# Patient Record
Sex: Female | Born: 1955 | Race: Black or African American | Hispanic: No | Smoking: Never smoker
Health system: Southern US, Community
[De-identification: ages and names within clinical notes are randomized; demographics above are authoritative.]

## PROBLEM LIST (undated history)

## (undated) DIAGNOSIS — H3320 Serous retinal detachment, unspecified eye: Secondary | ICD-10-CM

## (undated) DIAGNOSIS — H543 Unqualified visual loss, both eyes: Secondary | ICD-10-CM

## (undated) DIAGNOSIS — H5210 Myopia, unspecified eye: Secondary | ICD-10-CM

## (undated) DIAGNOSIS — J45909 Unspecified asthma, uncomplicated: Secondary | ICD-10-CM

## (undated) DIAGNOSIS — K660 Peritoneal adhesions (postprocedural) (postinfection): Secondary | ICD-10-CM

## (undated) DIAGNOSIS — E538 Deficiency of other specified B group vitamins: Secondary | ICD-10-CM

## (undated) DIAGNOSIS — H269 Unspecified cataract: Secondary | ICD-10-CM

## (undated) DIAGNOSIS — R112 Nausea with vomiting, unspecified: Secondary | ICD-10-CM

## (undated) DIAGNOSIS — J189 Pneumonia, unspecified organism: Secondary | ICD-10-CM

## (undated) DIAGNOSIS — Z9889 Other specified postprocedural states: Secondary | ICD-10-CM

## (undated) DIAGNOSIS — K589 Irritable bowel syndrome without diarrhea: Secondary | ICD-10-CM

## (undated) DIAGNOSIS — D649 Anemia, unspecified: Secondary | ICD-10-CM

## (undated) DIAGNOSIS — E782 Mixed hyperlipidemia: Secondary | ICD-10-CM

## (undated) HISTORY — PX: KNEE ARTHROSCOPY: SUR90

## (undated) HISTORY — PX: EYE SURGERY: SHX253

## (undated) HISTORY — PX: PAROTIDECTOMY: SUR1003

## (undated) HISTORY — PX: ABDOMINAL HYSTERECTOMY: SHX81

---

## 1983-06-29 HISTORY — PX: RETINAL DETACHMENT SURGERY: SHX105

## 1983-08-29 HISTORY — PX: RETINAL DETACHMENT SURGERY: SHX105

## 1988-08-28 HISTORY — PX: VAGINAL HYSTERECTOMY: SHX2639

## 1990-08-28 HISTORY — PX: OOPHORECTOMY: SHX86

## 1992-08-28 HISTORY — PX: OOPHORECTOMY: SHX6387

## 2005-12-08 ENCOUNTER — Ambulatory Visit: Payer: Self-pay

## 2006-01-01 ENCOUNTER — Ambulatory Visit: Payer: Self-pay | Admitting: Unknown Physician Specialty

## 2008-03-17 ENCOUNTER — Ambulatory Visit: Payer: Self-pay | Admitting: Internal Medicine

## 2008-03-19 ENCOUNTER — Ambulatory Visit: Payer: Self-pay | Admitting: Internal Medicine

## 2008-03-23 ENCOUNTER — Ambulatory Visit: Payer: Self-pay | Admitting: Internal Medicine

## 2008-05-08 ENCOUNTER — Ambulatory Visit: Payer: Self-pay | Admitting: Gastroenterology

## 2008-05-12 ENCOUNTER — Ambulatory Visit: Payer: Self-pay | Admitting: Gastroenterology

## 2008-12-29 ENCOUNTER — Ambulatory Visit: Payer: Self-pay | Admitting: Unknown Physician Specialty

## 2011-08-20 ENCOUNTER — Ambulatory Visit: Payer: Self-pay | Admitting: Orthopedic Surgery

## 2012-03-22 DIAGNOSIS — R109 Unspecified abdominal pain: Secondary | ICD-10-CM | POA: Insufficient documentation

## 2012-08-01 DIAGNOSIS — H5213 Myopia, bilateral: Secondary | ICD-10-CM | POA: Insufficient documentation

## 2014-07-06 ENCOUNTER — Ambulatory Visit: Payer: Self-pay | Admitting: Family Medicine

## 2015-08-29 HISTORY — PX: CATARACT EXTRACTION W/ INTRAOCULAR LENS IMPLANT: SHX1309

## 2015-08-29 HISTORY — PX: RETINAL DETACHMENT SURGERY: SHX105

## 2016-09-27 ENCOUNTER — Other Ambulatory Visit: Payer: Self-pay | Admitting: Surgery

## 2016-09-27 DIAGNOSIS — M7542 Impingement syndrome of left shoulder: Secondary | ICD-10-CM

## 2016-09-27 DIAGNOSIS — M7582 Other shoulder lesions, left shoulder: Secondary | ICD-10-CM

## 2016-10-10 ENCOUNTER — Ambulatory Visit
Admission: RE | Admit: 2016-10-10 | Discharge: 2016-10-10 | Disposition: A | Discharge status: Home / Self Care | Payer: Medicare Other | Source: Ambulatory Visit | Attending: Surgery | Admitting: Surgery

## 2016-10-10 DIAGNOSIS — M7542 Impingement syndrome of left shoulder: Secondary | ICD-10-CM | POA: Insufficient documentation

## 2016-10-10 DIAGNOSIS — M7582 Other shoulder lesions, left shoulder: Secondary | ICD-10-CM | POA: Diagnosis not present

## 2016-11-20 DIAGNOSIS — E782 Mixed hyperlipidemia: Secondary | ICD-10-CM | POA: Insufficient documentation

## 2017-07-30 ENCOUNTER — Other Ambulatory Visit: Payer: Self-pay | Admitting: Family Medicine

## 2017-07-30 DIAGNOSIS — Z780 Asymptomatic menopausal state: Secondary | ICD-10-CM

## 2017-07-30 DIAGNOSIS — Z8781 Personal history of (healed) traumatic fracture: Secondary | ICD-10-CM

## 2017-08-07 ENCOUNTER — Other Ambulatory Visit: Payer: Medicare Other

## 2017-09-18 ENCOUNTER — Ambulatory Visit
Admission: RE | Admit: 2017-09-18 | Discharge: 2017-09-18 | Disposition: A | Discharge status: Home / Self Care | Payer: Medicare Other | Source: Ambulatory Visit | Attending: Family Medicine | Admitting: Family Medicine

## 2017-09-18 DIAGNOSIS — Z8781 Personal history of (healed) traumatic fracture: Secondary | ICD-10-CM | POA: Diagnosis present

## 2017-09-18 DIAGNOSIS — M85851 Other specified disorders of bone density and structure, right thigh: Secondary | ICD-10-CM | POA: Diagnosis not present

## 2017-09-18 DIAGNOSIS — Z780 Asymptomatic menopausal state: Secondary | ICD-10-CM | POA: Diagnosis not present

## 2018-02-07 DIAGNOSIS — E538 Deficiency of other specified B group vitamins: Secondary | ICD-10-CM | POA: Insufficient documentation

## 2018-02-12 ENCOUNTER — Other Ambulatory Visit: Payer: Self-pay | Admitting: Pediatrics

## 2018-02-12 DIAGNOSIS — E049 Nontoxic goiter, unspecified: Secondary | ICD-10-CM

## 2018-02-12 DIAGNOSIS — E040 Nontoxic diffuse goiter: Secondary | ICD-10-CM

## 2018-02-20 ENCOUNTER — Ambulatory Visit
Admission: RE | Admit: 2018-02-20 | Discharge: 2018-02-20 | Disposition: A | Discharge status: Home / Self Care | Payer: Medicare Other | Source: Ambulatory Visit | Attending: Family Medicine | Admitting: Family Medicine

## 2018-02-20 ENCOUNTER — Ambulatory Visit
Admission: RE | Admit: 2018-02-20 | Discharge: 2018-02-20 | Disposition: A | Discharge status: Home / Self Care | Payer: Medicare Other | Source: Ambulatory Visit | Attending: Pediatrics | Admitting: Pediatrics

## 2018-02-20 ENCOUNTER — Other Ambulatory Visit: Payer: Self-pay | Admitting: Family Medicine

## 2018-02-20 DIAGNOSIS — M160 Bilateral primary osteoarthritis of hip: Secondary | ICD-10-CM | POA: Diagnosis not present

## 2018-02-20 DIAGNOSIS — E042 Nontoxic multinodular goiter: Secondary | ICD-10-CM | POA: Diagnosis not present

## 2018-02-20 DIAGNOSIS — R52 Pain, unspecified: Secondary | ICD-10-CM

## 2018-02-20 DIAGNOSIS — M25552 Pain in left hip: Secondary | ICD-10-CM | POA: Diagnosis present

## 2018-02-20 DIAGNOSIS — E040 Nontoxic diffuse goiter: Secondary | ICD-10-CM

## 2018-02-20 DIAGNOSIS — E049 Nontoxic goiter, unspecified: Secondary | ICD-10-CM

## 2018-04-15 ENCOUNTER — Encounter: Payer: Self-pay | Admitting: *Deleted

## 2018-04-16 ENCOUNTER — Ambulatory Visit: Payer: Medicare Other | Admitting: Anesthesiology

## 2018-04-16 ENCOUNTER — Ambulatory Visit
Admission: RE | Admit: 2018-04-16 | Discharge: 2018-04-16 | Disposition: A | Discharge status: Home / Self Care | Payer: Medicare Other | Source: Ambulatory Visit | Attending: Gastroenterology | Admitting: Gastroenterology

## 2018-04-16 ENCOUNTER — Other Ambulatory Visit: Payer: Self-pay

## 2018-04-16 ENCOUNTER — Encounter: Payer: Self-pay | Admitting: *Deleted

## 2018-04-16 ENCOUNTER — Encounter
Admission: RE | Disposition: A | Discharge status: Home / Self Care | Payer: Self-pay | Source: Ambulatory Visit | Attending: Gastroenterology

## 2018-04-16 DIAGNOSIS — Z882 Allergy status to sulfonamides status: Secondary | ICD-10-CM | POA: Diagnosis not present

## 2018-04-16 DIAGNOSIS — Z7951 Long term (current) use of inhaled steroids: Secondary | ICD-10-CM | POA: Insufficient documentation

## 2018-04-16 DIAGNOSIS — K581 Irritable bowel syndrome with constipation: Secondary | ICD-10-CM | POA: Diagnosis not present

## 2018-04-16 DIAGNOSIS — K219 Gastro-esophageal reflux disease without esophagitis: Secondary | ICD-10-CM | POA: Diagnosis present

## 2018-04-16 DIAGNOSIS — K580 Irritable bowel syndrome with diarrhea: Secondary | ICD-10-CM | POA: Insufficient documentation

## 2018-04-16 DIAGNOSIS — Z91041 Radiographic dye allergy status: Secondary | ICD-10-CM | POA: Diagnosis not present

## 2018-04-16 DIAGNOSIS — H5210 Myopia, unspecified eye: Secondary | ICD-10-CM | POA: Diagnosis not present

## 2018-04-16 DIAGNOSIS — K6389 Other specified diseases of intestine: Secondary | ICD-10-CM | POA: Diagnosis not present

## 2018-04-16 DIAGNOSIS — Z888 Allergy status to other drugs, medicaments and biological substances status: Secondary | ICD-10-CM | POA: Diagnosis not present

## 2018-04-16 DIAGNOSIS — K2950 Unspecified chronic gastritis without bleeding: Secondary | ICD-10-CM | POA: Insufficient documentation

## 2018-04-16 DIAGNOSIS — Z79899 Other long term (current) drug therapy: Secondary | ICD-10-CM | POA: Diagnosis not present

## 2018-04-16 DIAGNOSIS — Z91018 Allergy to other foods: Secondary | ICD-10-CM | POA: Insufficient documentation

## 2018-04-16 DIAGNOSIS — Z91013 Allergy to seafood: Secondary | ICD-10-CM | POA: Insufficient documentation

## 2018-04-16 DIAGNOSIS — J45909 Unspecified asthma, uncomplicated: Secondary | ICD-10-CM | POA: Insufficient documentation

## 2018-04-16 HISTORY — PX: COLONOSCOPY WITH PROPOFOL: SHX5780

## 2018-04-16 HISTORY — DX: Serous retinal detachment, unspecified eye: H33.20

## 2018-04-16 HISTORY — DX: Peritoneal adhesions (postprocedural) (postinfection): K66.0

## 2018-04-16 HISTORY — DX: Myopia, unspecified eye: H52.10

## 2018-04-16 HISTORY — PX: ESOPHAGOGASTRODUODENOSCOPY (EGD) WITH PROPOFOL: SHX5813

## 2018-04-16 HISTORY — DX: Unspecified cataract: H26.9

## 2018-04-16 HISTORY — DX: Irritable bowel syndrome, unspecified: K58.9

## 2018-04-16 HISTORY — DX: Unqualified visual loss, both eyes: H54.3

## 2018-04-16 HISTORY — DX: Other specified postprocedural states: R11.2

## 2018-04-16 HISTORY — DX: Nausea with vomiting, unspecified: Z98.890

## 2018-04-16 HISTORY — DX: Unspecified asthma, uncomplicated: J45.909

## 2018-04-16 SURGERY — ESOPHAGOGASTRODUODENOSCOPY (EGD) WITH PROPOFOL
Anesthesia: General

## 2018-04-16 MED ORDER — PROPOFOL 10 MG/ML IV BOLUS
INTRAVENOUS | Status: DC | PRN
  Administered 2018-04-16: 40 mg via INTRAVENOUS

## 2018-04-16 MED ORDER — SODIUM CHLORIDE 0.9 % IV SOLN
INTRAVENOUS | Status: DC
  Administered 2018-04-16: 10:00:00 via INTRAVENOUS

## 2018-04-16 MED ORDER — IPRATROPIUM-ALBUTEROL 0.5-2.5 (3) MG/3ML IN SOLN
RESPIRATORY_TRACT | Status: AC
  Filled 2018-04-16: qty 3

## 2018-04-16 MED ORDER — FENTANYL CITRATE (PF) 100 MCG/2ML IJ SOLN
INTRAMUSCULAR | Status: DC | PRN
  Administered 2018-04-16: 50 ug via INTRAVENOUS

## 2018-04-16 MED ORDER — EPHEDRINE SULFATE 50 MG/ML IJ SOLN
INTRAMUSCULAR | Status: DC | PRN
  Administered 2018-04-16: 10 mg via INTRAVENOUS

## 2018-04-16 MED ORDER — MIDAZOLAM HCL 2 MG/2ML IJ SOLN
INTRAMUSCULAR | Status: AC
  Filled 2018-04-16: qty 2

## 2018-04-16 MED ORDER — FENTANYL CITRATE (PF) 100 MCG/2ML IJ SOLN
INTRAMUSCULAR | Status: AC
  Filled 2018-04-16: qty 2

## 2018-04-16 MED ORDER — PROPOFOL 10 MG/ML IV BOLUS
INTRAVENOUS | Status: AC
  Filled 2018-04-16: qty 20

## 2018-04-16 MED ORDER — MIDAZOLAM HCL 2 MG/2ML IJ SOLN
INTRAMUSCULAR | Status: DC | PRN
  Administered 2018-04-16: 1 mg via INTRAVENOUS

## 2018-04-16 MED ORDER — IPRATROPIUM-ALBUTEROL 0.5-2.5 (3) MG/3ML IN SOLN: 3.0000 mL | Freq: Once | RESPIRATORY_TRACT | Status: DC

## 2018-04-16 MED ORDER — ONDANSETRON HCL 4 MG/2ML IJ SOLN
INTRAMUSCULAR | Status: AC
  Administered 2018-04-16: 4 mg via INTRAVENOUS
  Filled 2018-04-16: qty 2

## 2018-04-16 MED ORDER — SODIUM CHLORIDE 0.9 % IV SOLN
INTRAVENOUS | Status: DC
  Administered 2018-04-16: 1000 mL via INTRAVENOUS

## 2018-04-16 MED ORDER — PROPOFOL 500 MG/50ML IV EMUL
INTRAVENOUS | Status: DC | PRN
  Administered 2018-04-16: 150 ug/kg/min via INTRAVENOUS

## 2018-04-16 NOTE — Anesthesia Postprocedure Evaluation (Signed)
Anesthesia Post Note  Patient: Chelsey Price  Procedure(s) Performed: ESOPHAGOGASTRODUODENOSCOPY (EGD) WITH PROPOFOL (N/A ) COLONOSCOPY WITH PROPOFOL (N/A )  Patient location during evaluation: Endoscopy Anesthesia Type: General Level of consciousness: awake and alert Pain management: pain level controlled Vital Signs Assessment: post-procedure vital signs reviewed and stable Respiratory status: spontaneous breathing, nonlabored ventilation, respiratory function stable and patient connected to nasal cannula oxygen Cardiovascular status: blood pressure returned to baseline and stable Postop Assessment: no apparent nausea or vomiting Anesthetic complications: no     Last Vitals:  Vitals:   04/16/18 1210 04/16/18 1220  BP: 101/61 114/74  Pulse: 79 86  Resp: 18 (!) 25  Temp:    SpO2: 100% 100%    Last Pain:  Vitals:   04/16/18 1140  TempSrc: Tympanic  PainSc: 0-No pain                 Precious Haws Piscitello

## 2018-04-16 NOTE — Op Note (Addendum)
[facility] Gastroenterology Patient Name: Chelsey Price Procedure Date: 04/16/2018 10:21 AM MRN: [id number] Account #: [id number] Date of Birth: [birth year] Admit Type: Outpatient Age: 62 Room: [facility] ENDO ROOM 3 Gender: Female Note Status: Finalized Procedure:            Upper GI endoscopy Indications:          Follow-up of gastro-esophageal reflux disease, Nausea Providers:            Lollie Sails, MD Referring MD:         Gayland Curry MD, MD (Referring MD) Medicines:            Monitored Anesthesia Care Complications:        No immediate complications. Procedure:            Pre-Anesthesia Assessment:                       - ASA Grade Assessment: III - A patient with severe                        systemic disease.                       After obtaining informed consent, the endoscope was                        passed under direct vision. Throughout the procedure,                        the patient's blood pressure, pulse, and oxygen                        saturations were monitored continuously. The Endoscope                        was introduced through the mouth, and advanced to the                        third part of duodenum. The upper GI endoscopy was                        accomplished without difficulty. The patient tolerated                        the procedure well. Findings:      The Z-line was regular. Biopsies were taken with a cold forceps for       histology.      The exam of the esophagus was otherwise normal.      Localized mild inflammation characterized by congestion (edema) and       granularity was found in the posterior gastric antrum. Biopsies were       taken with a cold forceps for histology.      A single 1 mm no bleeding angioectasia was found on the posterior wall       of the gastric antrum. This was deep to the mucosa and did not bleed       with flushing.      The cardia and gastric fundus were normal on  retroflexion.      Biopsies were taken with a cold forceps in the gastric body for       histology.  Diffuse mild mucosal variance characterized by smoothness was found in       the entire duodenum. Biopsies were taken with a cold forceps for       histology. Impression:           - Z-line regular. Biopsied.                       - Gastritis. Biopsied.                       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                       - Mucosal variant in the duodenum. Biopsied.                       - Biopsies were taken with a cold forceps for histology                        in the gastric body. Recommendation:       - Perform a colonoscopy today.                       - Await pathology results.                       - Return to GI clinic in 1 month. If patient is heme                        positive or with IDA, would consider VCE to assess for                        other AVM and consider repeat EGD for treatment of AVM.                       - Use sucralfate tablets 1 gram PO QID for 1 month. Procedure Code(s):    --- Professional ---                       [phone], Esophagogastroduodenoscopy, flexible, transoral;                        with biopsy, single or multiple CPT copyright 2017 American Medical Association. All rights reserved. The codes documented in this report are preliminary and upon coder review may  be revised to meet current compliance requirements. Lollie Sails, MD 04/16/2018 10:50:15 AM This report has been signed electronically. Number of Addenda: 0 Note Initiated On: 04/16/2018 10:21 AM      [facility]

## 2018-04-16 NOTE — Anesthesia Preprocedure Evaluation (Signed)
Anesthesia Evaluation  Patient identified by MRN, date of birth, ID band Patient awake    Reviewed: Allergy & Precautions, H&P , NPO status , Patient's Chart, lab work & pertinent test results  History of Anesthesia Complications (+) PONV and history of anesthetic complications  Airway Mallampati: II  TM Distance: >3 FB Neck ROM: limited    Dental  (+) Chipped, Poor Dentition, Missing   Pulmonary neg shortness of breath, asthma ,           Cardiovascular Exercise Tolerance: Good (-) angina(-) Past MI and (-) DOE negative cardio ROS       Neuro/Psych negative neurological ROS  negative psych ROS   GI/Hepatic negative GI ROS, Neg liver ROS, neg GERD  ,  Endo/Other  negative endocrine ROS  Renal/GU negative Renal ROS  negative genitourinary   Musculoskeletal   Abdominal   Peds  Hematology negative hematology ROS (+)   Anesthesia Other Findings Past Medical History: No date: Abdominal adhesions No date: Asthma without status asthmaticus No date: Cataract of both eyes No date: High myopia No date: IBS (irritable bowel syndrome) No date: Low vision, both eyes No date: PONV (postoperative nausea and vomiting) No date: Retinal detachment  Past Surgical History: No date: ABDOMINAL HYSTERECTOMY No date: EYE SURGERY No date: KNEE ARTHROSCOPY No date: PAROTIDECTOMY  BMI    Body Mass Index:  27.98 kg/m      Reproductive/Obstetrics negative OB ROS                             Anesthesia Physical Anesthesia Plan  ASA: III  Anesthesia Plan: General   Post-op Pain Management:    Induction: Intravenous  PONV Risk Score and Plan: Propofol infusion and TIVA  Airway Management Planned: Natural Airway and Nasal Cannula  Additional Equipment:   Intra-op Plan:   Post-operative Plan:   Informed Consent: I have reviewed the patients History and Physical, chart, labs and discussed  the procedure including the risks, benefits and alternatives for the proposed anesthesia with the patient or authorized representative who has indicated his/her understanding and acceptance.   Dental Advisory Given  Plan Discussed with: Anesthesiologist, CRNA and Surgeon  Anesthesia Plan Comments: (Patient consented for risks of anesthesia including but not limited to:  - adverse reactions to medications - risk of intubation if required - damage to teeth, lips or other oral mucosa - sore throat or hoarseness - Damage to heart, brain, lungs or loss of life  Patient voiced understanding.)        Anesthesia Quick Evaluation

## 2018-04-16 NOTE — OR Nursing (Signed)
PT advised to take levbid 0.375 twice dailey. Citrucel one dose dailey 3 acbe TO be set up by office

## 2018-04-16 NOTE — Transfer of Care (Signed)
Immediate Anesthesia Transfer of Care Note  Patient: Chelsey Price  Procedure(s) Performed: ESOPHAGOGASTRODUODENOSCOPY (EGD) WITH PROPOFOL (N/A ) COLONOSCOPY WITH PROPOFOL (N/A )  Patient Location: PACU  Anesthesia Type:General  Level of Consciousness: sedated  Airway & Oxygen Therapy: Patient Spontanous Breathing and Patient connected to nasal cannula oxygen  Post-op Assessment: Report given to RN and Post -op Vital signs reviewed and stable  Post vital signs: Reviewed and stable  Last Vitals:  Vitals Value Taken Time  BP 94/51 04/16/2018 11:40 AM  Temp 36.2 C 04/16/2018 11:40 AM  Pulse 88 04/16/2018 11:42 AM  Resp 20 04/16/2018 11:42 AM  SpO2 100 % 04/16/2018 11:42 AM  Vitals shown include unvalidated device data.  Last Pain:  Vitals:   04/16/18 1140  TempSrc: Tympanic  PainSc: 0-No pain         Complications: No apparent anesthesia complications

## 2018-04-16 NOTE — Anesthesia Procedure Notes (Signed)
Date/Time: 04/16/2018 10:20 AM Performed by: Allean Found, CRNA Pre-anesthesia Checklist: Emergency Drugs available, Patient identified, Suction available, Patient being monitored and Timeout performed Patient Re-evaluated:Patient Re-evaluated prior to induction Oxygen Delivery Method: Nasal cannula Placement Confirmation: positive ETCO2

## 2018-04-16 NOTE — Anesthesia Post-op Follow-up Note (Signed)
Anesthesia QCDR form completed.        

## 2018-04-16 NOTE — Op Note (Signed)
[facility] Gastroenterology Patient Name: Chelsey Price Procedure Date: 04/16/2018 10:21 AM MRN: [id number] Account #: [id number] Date of Birth: 20-Feb-1956 Admit Type: Outpatient Age: 62 Room: [facility] ENDO ROOM 3 Gender: Female Note Status: Finalized Procedure:            Colonoscopy Indications:          Change in bowel habits Providers:            Lollie Sails, MD Referring MD:         Gayland Curry MD, MD (Referring MD) Medicines:            Monitored Anesthesia Care Complications:        No immediate complications. Procedure:            Pre-Anesthesia Assessment:                       - ASA Grade Assessment: III - A patient with severe                        systemic disease.                       After obtaining informed consent, the colonoscope was                        passed under direct vision. Throughout the procedure,                        the patient's blood pressure, pulse, and oxygen                        saturations were monitored continuously. The                        Colonoscope was introduced through the anus with the                        intention of advancing to the cecum. The scope was                        advanced to the hepatic flexure before the procedure                        was aborted. Medications were given. The colonoscopy                        was unusually difficult due to a redundant colon and                        significant looping. Findings:      The sigmoid colon, descending colon and transverse colon were       significantly redundant. I advanced the scope to about the hepatic       flexure region, and despite reduction maneuvers, abdominal support and       position change I was unable to move further forward.      A few medium-mouthed diverticula were found in the sigmoid colon,       descending colon and transverse colon.      Biopsies for histology were taken with a cold forceps from the left    colon  for evaluation of microscopic colitis.      The digital rectal exam was normal. Impression:           - Redundant colon.                       - Diverticulosis in the sigmoid colon, in the                        descending colon and in the transverse colon.                       - Biopsies were taken with a cold forceps from the left                        colon for evaluation of microscopic colitis. Recommendation:       - Discharge patient to home.                       - Perform an air contrast barium enema at appointment                        to be scheduled. Procedure Code(s):    --- Professional ---                       [phone], 69, Colonoscopy, flexible; with biopsy, single                        or multiple Diagnosis Code(s):    --- Professional ---                       R19.4, Change in bowel habit                       K57.30, Diverticulosis of large intestine without                        perforation or abscess without bleeding                       Q43.8, Other specified congenital malformations of                        intestine CPT copyright 2017 American Medical Association. All rights reserved. The codes documented in this report are preliminary and upon coder review may  be revised to meet current compliance requirements. Lollie Sails, MD 04/16/2018 11:42:00 AM This report has been signed electronically. Number of Addenda: 0 Note Initiated On: 04/16/2018 10:21 AM Total Procedure Duration: 0 hours 37 minutes 13 seconds       [facility]

## 2018-04-16 NOTE — H&P (Signed)
Outpatient short stay form Pre-procedure 04/16/2018 10:14 AM Chelsey Sails MD  Primary Physician: Dr. Gayland Curry  Reason for visit: EGD and colonoscopy  History of present illness: Patient is a 62 year old female presenting today as above.  She has problems with irritable bowel syndrome of a variable nature both constipation and diarrhea for quite some time.  This however seems to be getting a bit worse.  She is also had some problems with nausea.  She denies any heartburn.  Was prescribed a proton pump inhibitor however she is not taking that medication.  She is also been prescribed iron supplement which she also is not taking currently.  Tolerating her prep well.  She takes no aspirin or blood thinning agent.  She will rarely take an NSAID.    Current Facility-Administered Medications:  .  0.9 %  sodium chloride infusion, , Intravenous, Continuous, Chelsey Sails, MD .  0.9 %  sodium chloride infusion, , Intravenous, Continuous, Chelsey Sails, MD, Last Rate: 20 mL/hr at 04/16/18 0854, 1,000 mL at 04/16/18 0854 .  ipratropium-albuterol (DUONEB) 0.5-2.5 (3) MG/3ML nebulizer solution 3 mL, 3 mL, Nebulization, Once, Piscitello, Precious Haws, MD  Medications Prior to Admission  Medication Sig Dispense Refill Last Dose  . albuterol (PROVENTIL HFA;VENTOLIN HFA) 108 (90 Base) MCG/ACT inhaler Inhale 2 puffs into the lungs every 4 (four) hours as needed for wheezing or shortness of breath.   Past Month at Unknown time  . betamethasone dipropionate 0.05 % lotion Apply topically as needed.   Past Month at Unknown time  . cyanocobalamin 1000 MCG tablet Take 1,000 mcg by mouth daily.   Past Week at Unknown time  . dicyclomine (BENTYL) 10 MG capsule Take 10 mg by mouth 4 (four) times daily -  before meals and at bedtime.   Past Month at Unknown time  . ferrous sulfate 220 (44 Fe) MG/5ML solution Take 220 mg by mouth daily.   Past Month at Unknown time  . Fluticasone-Salmeterol (ADVAIR)  100-50 MCG/DOSE AEPB Inhale 1 puff into the lungs 2 (two) times daily.   Past Month at Unknown time  . montelukast (SINGULAIR) 10 MG tablet Take 10 mg by mouth at bedtime.   Past Month at Unknown time  . omeprazole (PRILOSEC) 40 MG capsule Take 40 mg by mouth daily.   Past Month at Unknown time     Allergies  Allergen Reactions  . Iodine   . Shellfish Allergy   . Sulfa Antibiotics   . Tomato      Past Medical History:  Diagnosis Date  . Abdominal adhesions   . Asthma without status asthmaticus   . Cataract of both eyes   . High myopia   . IBS (irritable bowel syndrome)   . Low vision, both eyes   . PONV (postoperative nausea and vomiting)   . Retinal detachment     Review of systems:      Physical Exam    Heart and lungs: Rhythm without rub or gallop, lungs are bilaterally clear.    HEENT: Normocephalic atraumatic eyes are anicteric    Other:    Pertinant exam for procedure: Soft nontender nondistended bowel sounds positive normoactive.  Mild discomfort in the lateral right upper quadrant to palpation.  There are no masses or rebound.    Planned proceedures: EGD, colonoscopy and indicated procedures. I have discussed the risks benefits and complications of procedures to include not limited to bleeding, infection, perforation and the risk of sedation and the patient  wishes to proceed.    Chelsey Sails, MD Gastroenterology 04/16/2018  10:14 AM

## 2018-04-17 ENCOUNTER — Encounter: Payer: Self-pay | Admitting: Gastroenterology

## 2018-04-19 LAB — SURGICAL PATHOLOGY

## 2018-05-21 ENCOUNTER — Other Ambulatory Visit: Payer: Self-pay | Admitting: Gastroenterology

## 2018-05-21 DIAGNOSIS — Q438 Other specified congenital malformations of intestine: Secondary | ICD-10-CM

## 2018-09-03 ENCOUNTER — Inpatient Hospital Stay: Payer: Medicare Other | Attending: Oncology | Admitting: Oncology

## 2018-09-03 ENCOUNTER — Inpatient Hospital Stay: Payer: Medicare Other

## 2018-09-03 ENCOUNTER — Other Ambulatory Visit: Payer: Self-pay

## 2018-09-03 ENCOUNTER — Encounter: Payer: Self-pay | Admitting: Oncology

## 2018-09-03 VITALS — BP 114/79 | HR 79 | Temp 96.4°F | Resp 18 | Ht 64.0 in | Wt 167.5 lb

## 2018-09-03 DIAGNOSIS — R61 Generalized hyperhidrosis: Secondary | ICD-10-CM | POA: Insufficient documentation

## 2018-09-03 DIAGNOSIS — Z8719 Personal history of other diseases of the digestive system: Secondary | ICD-10-CM | POA: Diagnosis not present

## 2018-09-03 DIAGNOSIS — R531 Weakness: Secondary | ICD-10-CM | POA: Diagnosis not present

## 2018-09-03 DIAGNOSIS — Z79899 Other long term (current) drug therapy: Secondary | ICD-10-CM | POA: Diagnosis not present

## 2018-09-03 DIAGNOSIS — K589 Irritable bowel syndrome without diarrhea: Secondary | ICD-10-CM | POA: Diagnosis not present

## 2018-09-03 DIAGNOSIS — D649 Anemia, unspecified: Secondary | ICD-10-CM | POA: Diagnosis not present

## 2018-09-03 DIAGNOSIS — R5383 Other fatigue: Secondary | ICD-10-CM

## 2018-09-03 LAB — TECHNOLOGIST SMEAR REVIEW: Tech Review: ADEQUATE

## 2018-09-03 LAB — CBC WITH DIFFERENTIAL/PLATELET
Abs Immature Granulocytes: 0.01 10*3/uL (ref 0.00–0.07)
BASOS ABS: 0 10*3/uL (ref 0.0–0.1)
Basophils Relative: 1 %
EOS ABS: 0.1 10*3/uL (ref 0.0–0.5)
EOS PCT: 3 %
HEMATOCRIT: 36.9 % (ref 36.0–46.0)
Hemoglobin: 11.4 g/dL — ABNORMAL LOW (ref 12.0–15.0)
Immature Granulocytes: 0 %
LYMPHS ABS: 1.9 10*3/uL (ref 0.7–4.0)
Lymphocytes Relative: 40 %
MCH: 26.1 pg (ref 26.0–34.0)
MCHC: 30.9 g/dL (ref 30.0–36.0)
MCV: 84.4 fL (ref 80.0–100.0)
MONO ABS: 0.3 10*3/uL (ref 0.1–1.0)
Monocytes Relative: 6 %
NRBC: 0 % (ref 0.0–0.2)
Neutro Abs: 2.4 10*3/uL (ref 1.7–7.7)
Neutrophils Relative %: 50 %
Platelets: 264 10*3/uL (ref 150–400)
RBC: 4.37 MIL/uL (ref 3.87–5.11)
RDW: 13.4 % (ref 11.5–15.5)
WBC: 4.7 10*3/uL (ref 4.0–10.5)

## 2018-09-03 LAB — COMPREHENSIVE METABOLIC PANEL
ALBUMIN: 4.3 g/dL (ref 3.5–5.0)
ALT: 14 U/L (ref 0–44)
AST: 16 U/L (ref 15–41)
Alkaline Phosphatase: 63 U/L (ref 38–126)
Anion gap: 6 (ref 5–15)
BUN: 24 mg/dL — AB (ref 8–23)
CHLORIDE: 106 mmol/L (ref 98–111)
CO2: 28 mmol/L (ref 22–32)
Calcium: 9.3 mg/dL (ref 8.9–10.3)
Creatinine, Ser: 0.88 mg/dL (ref 0.44–1.00)
GFR calc Af Amer: >60 mL/min (ref >60)
GFR calc non Af Amer: >60 mL/min (ref >60)
GLUCOSE: 91 mg/dL (ref 70–99)
Potassium: 4.3 mmol/L (ref 3.5–5.1)
SODIUM: 140 mmol/L (ref 135–145)
Total Bilirubin: 0.7 mg/dL (ref 0.3–1.2)
Total Protein: 6.8 g/dL (ref 6.5–8.1)

## 2018-09-03 LAB — RETIC PANEL
IMMATURE RETIC FRACT: 1.6 % — AB (ref 2.3–15.9)
RBC.: 4.37 MIL/uL (ref 3.87–5.11)
Retic Count, Absolute: 42.8 10*3/uL (ref 19.0–186.0)
Retic Ct Pct: 1 % (ref 0.4–3.1)
Reticulocyte Hemoglobin: 29.6 pg (ref >27.9)

## 2018-09-03 LAB — LACTATE DEHYDROGENASE: LDH: 145 U/L (ref 98–192)

## 2018-09-03 LAB — TSH: TSH: 1.191 u[IU]/mL (ref 0.350–4.500)

## 2018-09-03 NOTE — Progress Notes (Signed)
Patient here for initial visit. °

## 2018-09-03 NOTE — Progress Notes (Signed)
Hematology/Oncology Consult note [facility] Telephone:([phone] [phone]   Patient Care Team: Gayland Curry, MD as PCP - General (Family Medicine)  REFERRING PROVIDER: Gayland Curry, MD CHIEF COMPLAINTS/REASON FOR VISIT:  Evaluation of anemia  HISTORY OF PRESENTING ILLNESS:  Chelsey Price is a  63 y.o.  female with PMH listed below who was referred to me for evaluation of anemia Reviewed patient's recent labs that was done at [facility] office.  08/06/2018 labs revealed anemia with hemoglobin of 11.2 Reviewed patient's previous labs ordered by primary care physician's office, anemia is chronic onset , duration is since June 2019 when her hemoglobin started to trend down to 11.9. She has occasionally low hemoglobin counts between 11-12 back in 2013 and 2015 No aggravating or improving factors.  Associated signs and symptoms: Patient reports fatigue.  Denies SOB with exertion.  Also had unexplained night sweats.  No unintentional weight loss. Denies hematochezia, hemoptysis, hematuria. Context: History of GI bleeding: Denies               History of Chronic kidney disease denies               History of autoimmune disease denies               History of hemolytic anemia.                Remote history of anemia when patient was 63 years of age, had extensive work-up at that time at [facility] [place].  Per patient, bone marrow was also obtained.  She was told that her red blood cells were distracted prematurely.  She did not follow-up later on.                Last colonoscopy:  04/16/2018 upper endoscopy   Z-line regular. Biopsied. - Gastritis. Biopsied. 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 - Mucosal variant in the duodenum. Biopsied. - Biopsies were taken with a cold forceps for histology in the gastric body Biopsy negative for H. pylori, dysplasia or malignancy.  04/16/2018 colonoscopy Redundant  colon. - Diverticulosis in the sigmoid colon, in the descending colon and in the transverse colon. - Biopsies were taken with a cold forceps from the left colon for evaluation of microscopic colitis. Biopsies are negative for malignancy, microscopic colitis, dysplasia  She had a history of vitamin B12 deficiency, 02/06/2018 with a level of 192.  Patient has been getting parenteral vitamin B12 injections at the PCPs office.  Repeat vitamin B12 level 08/06/2018 showed level of 590.  Review of Systems  Constitutional: Positive for malaise/fatigue. Negative for chills, fever and weight loss.  HENT: Negative for sore throat.   Eyes: Negative for redness.  Respiratory: Negative for cough, shortness of breath and wheezing.   Cardiovascular: Negative for chest pain, palpitations and leg swelling.  Gastrointestinal: Negative for abdominal pain, blood in stool, nausea and vomiting.  Genitourinary: Negative for dysuria.  Musculoskeletal: Negative for myalgias.  Skin: Negative for rash.  Neurological: Negative for dizziness, tingling and tremors.  Endo/Heme/Allergies: Bruises/bleeds easily.  Psychiatric/Behavioral: Negative for hallucinations.    MEDICAL HISTORY:  Past Medical History:  Diagnosis Date  . Abdominal adhesions   . Asthma without status asthmaticus   . Cataract of both eyes   . High myopia   . IBS (irritable bowel syndrome)   . Low vision, both eyes   . PONV (postoperative nausea and vomiting)   . Retinal detachment     SURGICAL  HISTORY: Past Surgical History:  Procedure Laterality Date  . ABDOMINAL HYSTERECTOMY    . COLONOSCOPY WITH PROPOFOL N/A 04/16/2018   Procedure: COLONOSCOPY WITH PROPOFOL;  Surgeon: Lollie Sails, MD;  Location: [facility] ENDOSCOPY;  Service: Endoscopy;  Laterality: N/A;  . ESOPHAGOGASTRODUODENOSCOPY (EGD) WITH PROPOFOL N/A 04/16/2018   Procedure: ESOPHAGOGASTRODUODENOSCOPY (EGD) WITH PROPOFOL;  Surgeon: Lollie Sails, MD;  Location: [facility]  ENDOSCOPY;  Service: Endoscopy;  Laterality: N/A;  . EYE SURGERY    . KNEE ARTHROSCOPY    . PAROTIDECTOMY      SOCIAL HISTORY: Social History   Socioeconomic History  . Marital status: Married    Spouse name: Not on file  . Number of children: Not on file  . Years of education: Not on file  . Highest education level: Not on file  Occupational History  . Not on file  Social Needs  . Financial resource strain: Not on file  . Food insecurity:    Worry: Not on file    Inability: Not on file  . Transportation needs:    Medical: Not on file    Non-medical: Not on file  Tobacco Use  . Smoking status: Never Smoker  . Smokeless tobacco: Never Used  Substance and Sexual Activity  . Alcohol use: Never    Frequency: Never  . Drug use: Never  . Sexual activity: Not on file  Lifestyle  . Physical activity:    Days per week: Not on file    Minutes per session: Not on file  . Stress: Not on file  Relationships  . Social connections:    Talks on phone: Not on file    Gets together: Not on file    Attends religious service: Not on file    Active member of club or organization: Not on file    Attends meetings of clubs or organizations: Not on file    Relationship status: Not on file  . Intimate partner violence:    Fear of current or ex partner: Not on file    Emotionally abused: Not on file    Physically abused: Not on file    Forced sexual activity: Not on file  Other Topics Concern  . Not on file  Social History Narrative  . Not on file    FAMILY HISTORY: History reviewed. No pertinent family history.  ALLERGIES:  is allergic to iodine; shellfish allergy; sulfa antibiotics; and tomato.  MEDICATIONS:  Current Outpatient Medications  Medication Sig Dispense Refill  . albuterol (PROVENTIL HFA;VENTOLIN HFA) 108 (90 Base) MCG/ACT inhaler Inhale 2 puffs into the lungs every 4 (four) hours as needed for wheezing or shortness of breath.    . betamethasone dipropionate 0.05 %  lotion Apply topically as needed.    . cyanocobalamin (,VITAMIN B-12,) 1000 MCG/ML injection Inject 1,000 mcg into the muscle every 30 (thirty) days.    Marland Kitchen dicyclomine (BENTYL) 10 MG capsule Take 10 mg by mouth 4 (four) times daily -  before meals and at bedtime.    . Fluticasone-Salmeterol (ADVAIR) 100-50 MCG/DOSE AEPB Inhale 1 puff into the lungs 2 (two) times daily.    Marland Kitchen ibuprofen (ADVIL,MOTRIN) 600 MG tablet Take 600 mg by mouth every 6 (six) hours.    . montelukast (SINGULAIR) 10 MG tablet Take 10 mg by mouth at bedtime.    . pantoprazole (PROTONIX) 40 MG tablet Take by mouth.    . polyethylene glycol (MIRALAX / GLYCOLAX) packet Take by mouth. Take 17 g by mouth once daily as  needed. Mix in 4-8ounces of fluid prior to taking. Pt reports she for IBS flare     No current facility-administered medications for this visit.      PHYSICAL EXAMINATION: ECOG PERFORMANCE STATUS: 1 - Symptomatic but completely ambulatory Vitals:   09/03/18 0931  BP: 114/79  Pulse: 79  Resp: 18  Temp: (!) 96.4 F (35.8 C)  SpO2: 100%   Filed Weights   09/03/18 0931  Weight: 167 lb 8 oz (76 kg)    Physical Exam Constitutional:      General: She is not in acute distress. HENT:     Head: Normocephalic and atraumatic.  Eyes:     General: No scleral icterus.    Pupils: Pupils are equal, round, and reactive to light.  Neck:     Musculoskeletal: Normal range of motion and neck supple.  Cardiovascular:     Rate and Rhythm: Normal rate and regular rhythm.     Heart sounds: Normal heart sounds.  Pulmonary:     Effort: Pulmonary effort is normal. No respiratory distress.     Breath sounds: No wheezing.  Abdominal:     General: Bowel sounds are normal. There is no distension.     Palpations: Abdomen is soft. There is no mass.     Tenderness: There is no abdominal tenderness.  Musculoskeletal: Normal range of motion.        General: No deformity.  Skin:    General: Skin is warm and dry.     Findings:  No erythema or rash.  Neurological:     Mental Status: She is alert and oriented to person, place, and time.     Cranial Nerves: No cranial nerve deficit.     Coordination: Coordination normal.  Psychiatric:        Behavior: Behavior normal.        Thought Content: Thought content normal.      LABORATORY DATA:  I have reviewed the data as listed Lab Results  Component Value Date   WBC 4.7 09/03/2018   HGB 11.4 (L) 09/03/2018   HCT 36.9 09/03/2018   MCV 84.4 09/03/2018   PLT 264 09/03/2018   Recent Labs    09/03/18 1015  NA 140  K 4.3  CL 106  CO2 28  GLUCOSE 91  BUN 24*  CREATININE 0.88  CALCIUM 9.3  GFRNONAA >60  GFRAA >60  PROT 6.8  ALBUMIN 4.3  AST 16  ALT 14  ALKPHOS 63  BILITOT 0.7   Iron/TIBC/Ferritin/ %Sat No results found for: IRON, TIBC, FERRITIN, IRONPCTSAT   08/06/2018, vitamin B12 590.  RADIOGRAPHIC STUDIES: I have personally reviewed the radiological images as listed and agreed with the findings in the report. US thyroid 02/23/2018 3 cm left superior TR 1 cystic nodule does not meet criteria for biopsy follow-up.  Additional left thyroid subcentimeter nodule.  No right thyroid abnormality.  No adenopathy.  ASSESSMENT & PLAN:  1. Normocytic anemia   2. Other fatigue   3. Chronic night sweats    Anemia: multifactorial with possible causes including chronic blood loss, hyper/hypothyroidism, nutritional deficiency, infection/chronic inflammation, hemolysis, underlying bone marrow disorders. Patient has had done partial work-up including normal iron panel, normal B12 level Will check CBC w differential, CMP, reticulocytes,blood smear, TSH,  LDH, haptoglobin, monoclonal gammopathy evaluation.   Fatigue and chronic night sweat, etiology unknown.  Further management plan depend on above work-up results. Orders Placed This Encounter  Procedures  . CBC with Differential/Platelet    Standing Status:  Future    Number of Occurrences:   1    Standing  Expiration Date:   09/04/2019  . Retic Panel    Standing Status:   Future    Number of Occurrences:   1    Standing Expiration Date:   09/04/2019  . TSH    Standing Status:   Future    Number of Occurrences:   1    Standing Expiration Date:   09/04/2019  . Comprehensive metabolic panel    Standing Status:   Future    Number of Occurrences:   1    Standing Expiration Date:   09/04/2019  . Technologist smear review    Standing Status:   Future    Number of Occurrences:   1    Standing Expiration Date:   09/04/2019  . Multiple Myeloma Panel (SPEP&IFE w/QIG)    Standing Status:   Future    Number of Occurrences:   1    Standing Expiration Date:   09/03/2019  . Kappa/lambda light chains    Standing Status:   Future    Number of Occurrences:   1    Standing Expiration Date:   09/03/2019  . Lactate dehydrogenase    Standing Status:   Future    Number of Occurrences:   1    Standing Expiration Date:   09/04/2019  . Haptoglobin    Standing Status:   Future    Number of Occurrences:   1    Standing Expiration Date:   09/04/2019    All questions were answered. The patient knows to call the clinic with any problems questions or concerns.  Return of visit: 2 weeks Thank you for this kind referral and the opportunity to participate in the care of this patient. A copy of today's note is routed to referring provider  Total face to face encounter time for this patient visit was 40mn. >50% of the time was  spent in counseling and coordination of care.    ZEarlie Server MD, PhD Hematology Oncology [facility] LLCPager- [phone]/02/2019

## 2018-09-04 LAB — KAPPA/LAMBDA LIGHT CHAINS
KAPPA, LAMDA LIGHT CHAIN RATIO: 1.48 (ref 0.26–1.65)
Kappa free light chain: 14.4 mg/L (ref 3.3–19.4)
Lambda free light chains: 9.7 mg/L (ref 5.7–26.3)

## 2018-09-04 LAB — HAPTOGLOBIN: Haptoglobin: 171 mg/dL (ref 37–355)

## 2018-09-05 LAB — MULTIPLE MYELOMA PANEL, SERUM
Albumin SerPl Elph-Mcnc: 4 g/dL (ref 2.9–4.4)
Albumin/Glob SerPl: 1.7 (ref 0.7–1.7)
Alpha 1: 0.2 g/dL (ref 0.0–0.4)
Alpha2 Glob SerPl Elph-Mcnc: 0.6 g/dL (ref 0.4–1.0)
B-Globulin SerPl Elph-Mcnc: 0.9 g/dL (ref 0.7–1.3)
Gamma Glob SerPl Elph-Mcnc: 0.8 g/dL (ref 0.4–1.8)
Globulin, Total: 2.5 g/dL (ref 2.2–3.9)
IgA: 148 mg/dL (ref 87–352)
IgG (Immunoglobin G), Serum: 859 mg/dL (ref 700–1600)
IgM (Immunoglobulin M), Srm: 78 mg/dL (ref 26–217)
Total Protein ELP: 6.5 g/dL (ref 6.0–8.5)

## 2018-09-17 ENCOUNTER — Ambulatory Visit: Payer: Medicare Other | Admitting: Oncology

## 2018-09-25 ENCOUNTER — Inpatient Hospital Stay (HOSPITAL_BASED_OUTPATIENT_CLINIC_OR_DEPARTMENT_OTHER): Payer: Medicare Other | Admitting: Oncology

## 2018-09-25 ENCOUNTER — Encounter: Payer: Self-pay | Admitting: Oncology

## 2018-09-25 ENCOUNTER — Other Ambulatory Visit: Payer: Self-pay

## 2018-09-25 VITALS — BP 132/85 | HR 80 | Temp 96.5°F | Resp 18 | Wt 164.4 lb

## 2018-09-25 DIAGNOSIS — D649 Anemia, unspecified: Secondary | ICD-10-CM | POA: Diagnosis not present

## 2018-09-25 DIAGNOSIS — R5383 Other fatigue: Secondary | ICD-10-CM | POA: Diagnosis not present

## 2018-09-25 DIAGNOSIS — R61 Generalized hyperhidrosis: Secondary | ICD-10-CM | POA: Diagnosis not present

## 2018-09-25 DIAGNOSIS — R531 Weakness: Secondary | ICD-10-CM | POA: Diagnosis not present

## 2018-09-25 DIAGNOSIS — Z79899 Other long term (current) drug therapy: Secondary | ICD-10-CM

## 2018-09-25 DIAGNOSIS — K589 Irritable bowel syndrome without diarrhea: Secondary | ICD-10-CM

## 2018-09-25 DIAGNOSIS — Z8719 Personal history of other diseases of the digestive system: Secondary | ICD-10-CM

## 2018-09-25 NOTE — Progress Notes (Signed)
Patient here for follow up. No concerns voiced.  °

## 2018-09-26 NOTE — Progress Notes (Signed)
Hematology/Oncology Consult note [facility] Telephone:([phone] Fax:([phone]   Patient Care Team: Gayland Curry, MD as PCP - General (Family Medicine)  REFERRING PROVIDER: Gayland Curry, MD CHIEF COMPLAINTS/REASON FOR VISIT:  Evaluation of anemia  HISTORY OF PRESENTING ILLNESS:  Chelsey Price is a  63 y.o.  female with PMH listed below who was referred to me for evaluation of anemia Reviewed patient's recent labs that was done at [facility] office.  08/06/2018 labs revealed anemia with hemoglobin of 11.2 Reviewed patient's previous labs ordered by primary care physician's office, anemia is chronic onset , duration is since June 2019 when her hemoglobin started to trend down to 11.9. She has occasionally low hemoglobin counts between 11-12 back in 2013 and 2015 No aggravating or improving factors.  Associated signs and symptoms: Patient reports fatigue.  Denies SOB with exertion.  Also had unexplained night sweats.  No unintentional weight loss. Denies hematochezia, hemoptysis, hematuria. Context: History of GI bleeding: Denies               History of Chronic kidney disease denies               History of autoimmune disease denies               History of hemolytic anemia.                Remote history of anemia when patient was 63 years of age, had extensive work-up at that time at [place] [place].  Per patient, bone marrow was also obtained.  She was told that her red blood cells were distracted prematurely.  She did not follow-up later on.                Last colonoscopy:  04/16/2018 upper endoscopy   Z-line regular. Biopsied. - Gastritis. Biopsied. 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 - Mucosal variant in the duodenum. Biopsied. - Biopsies were taken with a cold forceps for histology in the gastric body Biopsy negative for H. pylori, dysplasia or malignancy.  04/16/2018 colonoscopy Redundant  colon. - Diverticulosis in the sigmoid colon, in the descending colon and in the transverse colon. - Biopsies were taken with a cold forceps from the left colon for evaluation of microscopic colitis. Biopsies are negative for malignancy, microscopic colitis, dysplasia  She had a history of vitamin B12 deficiency, 02/06/2018 with a level of 192.  Patient has been getting parenteral vitamin B12 injections at the [facility] office.  Repeat vitamin B12 level 08/06/2018 showed level of 590.  INTERVAL HISTORY Chelsey Price is a 63 y.o. female who has above history reviewed by me today presents for follow up visit for management of anemia.  During the interval she has had lab work-up done and present to discuss results. Problems and complaints are listed below: Reports no new complaints.  She has been getting parenteral vitamin B12 injections.  PCPs office. Chronic fatigue unchanged.  Review of Systems  Constitutional: Positive for malaise/fatigue. Negative for chills, fever and weight loss.  HENT: Negative for sore throat.   Eyes: Negative for redness.  Respiratory: Negative for cough, shortness of breath and wheezing.   Cardiovascular: Negative for chest pain, palpitations and leg swelling.  Gastrointestinal: Negative for abdominal pain, blood in stool, nausea and vomiting.  Genitourinary: Negative for dysuria.  Musculoskeletal: Negative for myalgias.  Skin: Negative for rash.  Neurological: Negative for dizziness, tingling and tremors.  Endo/Heme/Allergies: Bruises/bleeds easily.  Psychiatric/Behavioral: Negative for  hallucinations.    MEDICAL HISTORY:  Past Medical History:  Diagnosis Date  . Abdominal adhesions   . Asthma without status asthmaticus   . Cataract of both eyes   . High myopia   . IBS (irritable bowel syndrome)   . Low vision, both eyes   . PONV (postoperative nausea and vomiting)   . Retinal detachment     SURGICAL HISTORY: Past Surgical History:  Procedure  Laterality Date  . ABDOMINAL HYSTERECTOMY    . COLONOSCOPY WITH PROPOFOL N/A 04/16/2018   Procedure: COLONOSCOPY WITH PROPOFOL;  Surgeon: Lollie Sails, MD;  Location: [facility] ENDOSCOPY;  Service: Endoscopy;  Laterality: N/A;  . ESOPHAGOGASTRODUODENOSCOPY (EGD) WITH PROPOFOL N/A 04/16/2018   Procedure: ESOPHAGOGASTRODUODENOSCOPY (EGD) WITH PROPOFOL;  Surgeon: Lollie Sails, MD;  Location: [facility] ENDOSCOPY;  Service: Endoscopy;  Laterality: N/A;  . EYE SURGERY    . KNEE ARTHROSCOPY    . PAROTIDECTOMY      SOCIAL HISTORY: Social History   Socioeconomic History  . Marital status: Widowed    Spouse name: Not on file  . Number of children: Not on file  . Years of education: Not on file  . Highest education level: Not on file  Occupational History  . Not on file  Social Needs  . Financial resource strain: Not on file  . Food insecurity:    Worry: Not on file    Inability: Not on file  . Transportation needs:    Medical: Not on file    Non-medical: Not on file  Tobacco Use  . Smoking status: Never Smoker  . Smokeless tobacco: Never Used  Substance and Sexual Activity  . Alcohol use: Never    Frequency: Never  . Drug use: Never  . Sexual activity: Not on file  Lifestyle  . Physical activity:    Days per week: Not on file    Minutes per session: Not on file  . Stress: Not on file  Relationships  . Social connections:    Talks on phone: Not on file    Gets together: Not on file    Attends religious service: Not on file    Active member of club or organization: Not on file    Attends meetings of clubs or organizations: Not on file    Relationship status: Not on file  . Intimate partner violence:    Fear of current or ex partner: Not on file    Emotionally abused: Not on file    Physically abused: Not on file    Forced sexual activity: Not on file  Other Topics Concern  . Not on file  Social History Narrative  . Not on file    FAMILY HISTORY: Family History    Problem Relation Age of Onset  . Heart disease Father   . Dementia Father   . Diabetes Sister   . Lymphoma Maternal Uncle   . Breast cancer Maternal Grandmother   . Thyroid disease Sister   . Leukemia Cousin     ALLERGIES:  is allergic to iodine; shellfish allergy; sulfa antibiotics; and tomato.  MEDICATIONS:  Current Outpatient Medications  Medication Sig Dispense Refill  . albuterol (PROVENTIL HFA;VENTOLIN HFA) 108 (90 Base) MCG/ACT inhaler Inhale 2 puffs into the lungs every 4 (four) hours as needed for wheezing or shortness of breath.    . betamethasone dipropionate 0.05 % lotion Apply topically as needed.    . cyanocobalamin (,VITAMIN B-12,) 1000 MCG/ML injection Inject 1,000 mcg into the muscle every 30 (  thirty) days.    Marland Kitchen dicyclomine (BENTYL) 10 MG capsule Take 10 mg by mouth 4 (four) times daily -  before meals and at bedtime.    . Fluticasone-Salmeterol (ADVAIR) 100-50 MCG/DOSE AEPB Inhale 1 puff into the lungs 2 (two) times daily.    Marland Kitchen ibuprofen (ADVIL,MOTRIN) 600 MG tablet Take 600 mg by mouth every 6 (six) hours.    . montelukast (SINGULAIR) 10 MG tablet Take 10 mg by mouth at bedtime.    . pantoprazole (PROTONIX) 40 MG tablet Take 40 mg by mouth daily.     . polyethylene glycol (MIRALAX / GLYCOLAX) packet Take by mouth. Take 17 g by mouth once daily as needed. Mix in 4-8ounces of fluid prior to taking. Pt reports she for IBS flare     No current facility-administered medications for this visit.      PHYSICAL EXAMINATION: ECOG PERFORMANCE STATUS: 1 - Symptomatic but completely ambulatory Vitals:   09/25/18 1023  BP: 132/85  Pulse: 80  Resp: 18  Temp: (!) 96.5 F (35.8 C)   Filed Weights   09/25/18 1023  Weight: 164 lb 6.4 oz (74.6 kg)    Physical Exam Constitutional:      General: She is not in acute distress. HENT:     Head: Normocephalic and atraumatic.  Eyes:     General: No scleral icterus.    Pupils: Pupils are equal, round, and reactive to light.   Neck:     Musculoskeletal: Normal range of motion and neck supple.  Cardiovascular:     Rate and Rhythm: Normal rate and regular rhythm.     Heart sounds: Normal heart sounds.  Pulmonary:     Effort: Pulmonary effort is normal. No respiratory distress.     Breath sounds: No wheezing.  Abdominal:     General: Bowel sounds are normal. There is no distension.     Palpations: Abdomen is soft. There is no mass.     Tenderness: There is no abdominal tenderness.  Musculoskeletal: Normal range of motion.        General: No deformity.  Skin:    General: Skin is warm and dry.     Findings: No erythema or rash.  Neurological:     Mental Status: She is alert and oriented to person, place, and time.     Cranial Nerves: No cranial nerve deficit.     Coordination: Coordination normal.  Psychiatric:        Behavior: Behavior normal.        Thought Content: Thought content normal.      LABORATORY DATA:  I have reviewed the data as listed Lab Results  Component Value Date   WBC 4.7 09/03/2018   HGB 11.4 (L) 09/03/2018   HCT 36.9 09/03/2018   MCV 84.4 09/03/2018   PLT 264 09/03/2018   Recent Labs    09/03/18 1015  NA 140  K 4.3  CL 106  CO2 28  GLUCOSE 91  BUN 24*  CREATININE 0.88  CALCIUM 9.3  GFRNONAA >60  GFRAA >60  PROT 6.8  ALBUMIN 4.3  AST 16  ALT 14  ALKPHOS 63  BILITOT 0.7   Iron/TIBC/Ferritin/ %Sat No results found for: IRON, TIBC, FERRITIN, IRONPCTSAT   08/06/2018, vitamin B12 590.  RADIOGRAPHIC STUDIES: I have personally reviewed the radiological images as listed and agreed with the findings in the report. US thyroid 02/23/2018 3 cm left superior TR 1 cystic nodule does not meet criteria for biopsy follow-up.  Additional left  thyroid subcentimeter nodule.  No right thyroid abnormality.  No adenopathy.  ASSESSMENT & PLAN:  1. Normocytic anemia    Labs reviewed and discussed with patient in details,  Normal blood smear, normal haptoglobin, LDH.  Normal  multiple myeloma panel.  Normal TSH and CMP. Reticulocyte panel showed slightly low immature reticulocyte fraction. Likely slightly underproduction of bone marrow. Recommend repeat CBC twice a year and follow-up in clinic. Observation for now.  Orders Placed This Encounter  Procedures  . Retic Panel    Standing Status:   Future    Standing Expiration Date:   09/26/2019  . CBC with Differential/Platelet    Standing Status:   Future    Standing Expiration Date:   09/26/2019  . Comprehensive metabolic panel    Standing Status:   Future    Standing Expiration Date:   09/26/2019    All questions were answered. The patient knows to call the clinic with any problems questions or concerns.  Return of visit: 6 months   Earlie Server, MD, PhD Hematology Oncology [facility] Pager- [phone] 09/26/2018

## 2019-03-26 ENCOUNTER — Inpatient Hospital Stay: Payer: Medicare Other

## 2019-03-26 ENCOUNTER — Inpatient Hospital Stay: Payer: Medicare Other | Admitting: Oncology

## 2019-04-09 ENCOUNTER — Inpatient Hospital Stay: Payer: Medicare Other | Admitting: Oncology

## 2019-04-09 ENCOUNTER — Inpatient Hospital Stay: Payer: Medicare Other

## 2019-04-15 ENCOUNTER — Inpatient Hospital Stay: Payer: Medicare Other | Admitting: Oncology

## 2019-04-15 ENCOUNTER — Inpatient Hospital Stay: Payer: Medicare Other | Attending: Oncology

## 2019-10-10 ENCOUNTER — Ambulatory Visit: Payer: Medicare Other | Attending: Internal Medicine

## 2019-10-10 DIAGNOSIS — Z23 Encounter for immunization: Secondary | ICD-10-CM | POA: Insufficient documentation

## 2019-10-10 NOTE — Progress Notes (Signed)
   Covid-19 Vaccination Clinic  Name:  Chelsey Price    MRN: [id number] DOB: 1955-10-14  10/10/2019  Chelsey Price was observed post Covid-19 immunization for 15 minutes without incidence. She was provided with Vaccine Information Sheet and instruction to access the V-Safe system.   Chelsey Price was instructed to call 911 with any severe reactions post vaccine: Marland Kitchen Difficulty breathing  . Swelling of your face and throat  . A fast heartbeat  . A bad rash all over your body  . Dizziness and weakness    Immunizations Administered    Name Date Dose VIS Date Route   Moderna COVID-19 Vaccine 10/10/2019  1:05 PM 0.5 mL 07/29/2019 Intramuscular   Manufacturer: Moderna   Lot: [id number]   [place]:[id number]

## 2019-11-10 ENCOUNTER — Ambulatory Visit: Payer: Medicare Other | Attending: Internal Medicine

## 2019-11-10 DIAGNOSIS — Z23 Encounter for immunization: Secondary | ICD-10-CM

## 2019-11-10 NOTE — Progress Notes (Signed)
   Covid-19 Vaccination Clinic  Name:  Chelsey Price    MRN: [id number] DOB: Mar 13, 1956  11/10/2019  [name] was observed post Covid-19 immunization for 30 minutes based on pre-vaccination screening without incident. She was provided with Vaccine Information Sheet and instruction to access the V-Safe system.   [name] was instructed to call 911 with any severe reactions post vaccine: Marland Kitchen Difficulty breathing  . Swelling of face and throat  . A fast heartbeat  . A bad rash all over body  . Dizziness and weakness   Immunizations Administered    Name Date Dose VIS Date Route   Moderna COVID-19 Vaccine 11/10/2019  1:04 PM 0.5 mL 07/29/2019 Intramuscular   Manufacturer: Moderna   Lot: [id number]   [place][id number]

## 2020-04-08 ENCOUNTER — Other Ambulatory Visit: Payer: Self-pay | Admitting: Registered Nurse

## 2020-04-08 DIAGNOSIS — R102 Pelvic and perineal pain: Secondary | ICD-10-CM

## 2020-04-16 ENCOUNTER — Other Ambulatory Visit: Payer: Self-pay

## 2020-04-16 ENCOUNTER — Ambulatory Visit
Admission: RE | Admit: 2020-04-16 | Discharge: 2020-04-16 | Disposition: A | Discharge status: Home / Self Care | Payer: Medicare Other | Source: Ambulatory Visit | Attending: Registered Nurse | Admitting: Registered Nurse

## 2020-04-16 ENCOUNTER — Other Ambulatory Visit: Payer: Self-pay | Admitting: Registered Nurse

## 2020-04-16 DIAGNOSIS — R102 Pelvic and perineal pain: Secondary | ICD-10-CM

## 2021-09-15 ENCOUNTER — Other Ambulatory Visit: Payer: Self-pay | Admitting: Family Medicine

## 2021-09-15 DIAGNOSIS — J189 Pneumonia, unspecified organism: Secondary | ICD-10-CM

## 2021-09-16 ENCOUNTER — Other Ambulatory Visit: Payer: Self-pay | Admitting: Family Medicine

## 2021-09-16 ENCOUNTER — Ambulatory Visit
Admission: RE | Admit: 2021-09-16 | Discharge: 2021-09-16 | Disposition: A | Discharge status: Home / Self Care | Payer: Medicare Other | Source: Ambulatory Visit | Attending: Family Medicine | Admitting: Family Medicine

## 2021-09-16 ENCOUNTER — Other Ambulatory Visit: Payer: Self-pay

## 2021-09-16 ENCOUNTER — Ambulatory Visit: Admission: RE | Admit: 2021-09-16 | Payer: Medicare Other | Source: Ambulatory Visit

## 2021-09-16 ENCOUNTER — Ambulatory Visit: Payer: Medicare Other

## 2021-09-16 DIAGNOSIS — J189 Pneumonia, unspecified organism: Secondary | ICD-10-CM

## 2021-10-17 ENCOUNTER — Ambulatory Visit: Payer: Medicare Other | Attending: Otolaryngology | Admitting: Speech Pathology

## 2021-10-17 ENCOUNTER — Telehealth: Payer: Self-pay | Admitting: *Deleted

## 2021-10-17 ENCOUNTER — Other Ambulatory Visit: Payer: Self-pay

## 2021-10-17 DIAGNOSIS — R498 Other voice and resonance disorders: Secondary | ICD-10-CM | POA: Insufficient documentation

## 2021-10-17 DIAGNOSIS — R490 Dysphonia: Secondary | ICD-10-CM | POA: Diagnosis present

## 2021-10-17 NOTE — Telephone Encounter (Signed)
Pt has not been seen since 08/2018. She would need a new referral in order to be scheduled. I spoke to pt to make her aware and she understands. Will be in contact with provider who told her to make an appt to send a referral.

## 2021-10-17 NOTE — Telephone Encounter (Signed)
Patient requests follow up appointment.

## 2021-10-17 NOTE — Patient Instructions (Signed)
Abdominal Breathing : 15 minutes, twice a day   Shoulders down - this is a cue to relax Place your hand on your abdomen - this helps you focus on easy abdominal breath support - the best and most relaxed way to breathe Breathe in through your nose and fill your belly with air, watching your hand move outward Breathe out through your mouth and watch your belly move in. An audible "sh"  may help   Think of your belly as a balloon, when you fill with air (inhale), the balloon gets bigger. As the air goes out (exhale), the balloon deflates.  If you are having difficulty coordinating this, lay on your back with a plastic cup on your belly and repeat the above steps, watching your belly move up with inhalation and down with exhalations  Practice breathing in and out in front of a mirror, watching your belly Breathe in for a count of 5 and breathe out for a count of 5      VOICE CONSERVATION PROGRAM  1. Avoid overuse of voice or excessive use of the voice The vocal cords can become easily fatigued. Try to sort out what is necessary versus unnecessary talking in your environment. You do not need to STOP talking, but try to limit it as much as possible.  Think of resting your voice just as long as you talk. For example, if you talk for 5 minutes, rest your voice completely for 5 minutes. If your voice feels tired during the day, try to rest it as much as possible.  Avoid using an excessively loud voice or shouting/raising your voice When you yell or raise your voice, the vocal cords slam into each other, much like a strong hand clap. This causes irritation, and if this irritation continues, hoarseness may increase. If people in your home talk loudly, ask them to reduce the volume of their voices to help you decrease your volume as well. Sit near or face the person to whom you are speaking.   3. Avoid talking over background noise When talking in background noise, speech automatically has  increased loudness, and as in number 2 above, continued loud speech can result in increased hoarseness due to irritation to the vocal cords.  Do not talk over the radio or the TV. Mute them before speaking, go to a quieter place to talk, or sit next to the person with whom you are watching.  4. Talk in a voice that is soft, smooth, and gentle This allows the vocal cords to come together in a gentle way and allows the air being exhaled from the lungs to do most/all of the work when you are speaking.  5. Avoid excessive throat clearing, coughing, and loud laughter During these activities the vocal cords can also be slammed together in a hard way and foster irritation or swelling, which can cause hoarseness. Try taking sips of room temperature/cool water with hard swallows to clear secretions from the throat, instead of throat clearing or coughing. If you absolutely must clear your throat, do so as gently as possible. If you find yourself clearing your throat or coughing a lot, consult your physician. You will want to laugh. Continue to do so, but softly and gently. Do not laugh loudly for long periods of time because this can increase the chances for vocal fold irritation and thus hoarseness. Lozenges can help reduce the need to clear throat/cough during cold/allergy season.  6. Keep the mouth and throat lubricated Drink at  least 8-10 8 oz. glasses of water per day (64-80 oz.). This water can come in the form of drink mixes.  Caffeinated beverages such as colas, coffee, and tea (hot tea AND sweet tea) dry out the vocal cords and then can cause irritation and thus hoarseness.  Drinking water will keep your body hydrated. This will help to decrease secretions in the throat, which cause people to clear their throats or cough. If you are exercising outside (especially during drier weather), try to breathe through your nose as much as possible. If this is not possible, lifting the tongue up behind the  upper teeth when breathing through the mouth will add some moisture to the air breathed in past the vocal cords.  7. Avoid mouth breathing - breathe through your nose Your nose serves as a natural filter for dust and dirt particles from the air, and as a humidifier to moisten the air. Vocal cords like to work in moistened, filtered air. When you breathe through your mouth you lose the air filtering and moistening benefits of breathing through the nose. Be aware of breathing patterns when sitting quietly (e.g., reading or watching TV). Increase your awareness and try to change habits from mouth breathing to nose breathing during those times.  8. Avoid environmental and/or ingested irritants Try to avoid smoke-filled and or dusty environments. These items dry out the vocal cords and cause irritation.  9. Use an air filter if the home is dusty, and/or a humidifier if the air is dry  This will help to maintain clean, humid air to breathe. Remember, this type of air is what the vocal cords like best.

## 2021-10-18 NOTE — Therapy (Signed)
[place] MAIN [facility] SERVICES [place], [place], [address] Phone: [phone]   Fax:  [phone]  Speech Language Pathology Evaluation  Patient Details  Name: STELLAR GENSEL MRN: [id number] Date of Birth: November 18, 1955 Referring Provider (SLP): Dr. Clyde Canterbury   Encounter Date: 10/17/2021   End of Session - 10/18/21 1852     Visit Number 1    Number of Visits 13    Date for SLP Re-Evaluation 01/15/22    Authorization Type UHC MCR    Authorization - Visit Number 1    Progress Note Due on Visit 10    SLP Start Time 1100    SLP Stop Time  1200    SLP Time Calculation (min) 60 min    Activity Tolerance Patient tolerated treatment well             Past Medical History:  Diagnosis Date   Abdominal adhesions    Asthma without status asthmaticus    Cataract of both eyes    High myopia    IBS (irritable bowel syndrome)    Low vision, both eyes    PONV (postoperative nausea and vomiting)    Retinal detachment     Past Surgical History:  Procedure Laterality Date   ABDOMINAL HYSTERECTOMY     COLONOSCOPY WITH PROPOFOL N/A 04/16/2018   Procedure: COLONOSCOPY WITH PROPOFOL;  Surgeon: Lollie Sails, MD;  Location: [facility] ENDOSCOPY;  Service: Endoscopy;  Laterality: N/A;   ESOPHAGOGASTRODUODENOSCOPY (EGD) WITH PROPOFOL N/A 04/16/2018   Procedure: ESOPHAGOGASTRODUODENOSCOPY (EGD) WITH PROPOFOL;  Surgeon: Lollie Sails, MD;  Location: [facility] ENDOSCOPY;  Service: Endoscopy;  Laterality: N/A;   EYE SURGERY     KNEE ARTHROSCOPY     PAROTIDECTOMY      There were no vitals filed for this visit.   Subjective Assessment - 10/18/21 1849     Subjective "A lot of the time all I could do was text because people couldn't hear me on the phone."    Currently in Pain? No/denies                SLP Evaluation [facility] - 10/18/21 1850       SLP Visit Information   SLP Received On 10/17/21    Referring Provider (SLP) Dr.  Clyde Canterbury    Onset Date 08/16/21    Medical Diagnosis dysphonia      Subjective   Patient/Family Stated Goal talk on the phone, reduce vocal fatigue      General Information   HPI Patient is a 66 y.o. female with past medical history including GERD, asthma, bronchitis, thyroid nodules referred by Dr. Clyde Canterbury for dysphonia. Laryngoscopy on 10/10/21 showed TVC clear and mobile with no nodules, erythema or edema. Pt reported hoarseness after cough/illness that lasted a few weeks. Recently started reflux medications. ENT recommended voice therapy for vocal cord deconditioning exacerbated by irritation and strain from recent cough.    Behavioral/Cognition alert, cooperative, pleasant      Balance Screen   Has the patient fallen in the past 6 months No    Has the patient had a decrease in activity level because of a fear of falling?  No    Is the patient reluctant to leave their home because of a fear of falling?  No      Prior Functional Status   Cognitive/Linguistic Baseline Within functional limits    Vocation Retired      Associate Professor   Overall Cognitive Status  Within Functional Limits for tasks assessed      Auditory Comprehension   Overall Auditory Comprehension Appears within functional limits for tasks assessed      Visual Recognition/Discrimination   Discrimination Not tested      Reading Comprehension   Reading Status Within funtional limits      Expression   Primary Mode of Expression Verbal      Verbal Expression   Overall Verbal Expression Appears within functional limits for tasks assessed      Written Expression   Dominant Hand Right      Oral Motor/Sensory Function   Overall Oral Motor/Sensory Function Appears within functional limits for tasks assessed      Motor Speech   Overall Motor Speech Impaired    Respiration Impaired    Level of Impairment Sentence    Phonation Hoarse;Other (comment)   strained   Resonance Within functional limits     Articulation Within functional limitis    Intelligibility Intelligible    Motor Planning Witnin functional limits    Phonation Impaired    Tension Present Neck;Shoulder    Volume Soft    Pitch Low      Standardized Assessments   Standardized Assessments  Other Assessment             Perceptual Voice Evaluation    Voice Case History     Health risks:  hx reflux ; caffeine use approx. 8 oz daily, daily H20 intake average  64 oz, pt is a nonsmoker    Characteristic voice use: pt is retired and lives alone, but talks on the phone with friends and family intermittently throughout the day   Environmental risks: none reported    Misuse: chest-centered breathing pattern, speaking on residual capacity, strain/tension   Phonotraumatic behaviors:  none noted   Vocal characteristics:  hoarse, raspy strained, reduced vocal intensity, reduced ability to project, vocal fatigue  Objective Voice Measurements   Maximum phonation time for sustained ah: 5.3   Average fundamental frequency during sustained ah: 199 Hz   (1.7 SD below average of  244 Hz +/-27 for gender)     Relative Average Perterbuation for "ah": 1.703 %  Shimmer: 7.839 %  Noise to Harmonic Ratio: 0.34  Voice Turbulence Index: 0.057    Habitual pitch: 203    Highest dynamic pitch in conversational speech: 275 Hz   Lowest dynamic pitch in conversational speech: 143 Hz   Average time patient was able to sustain /s/:  6.2 seconds   Average time patient was able to sustain /z/: 4.6 seconds   s/z ratio:  (suggestive of dysfunction > 1.0) 1.34   V-RQOL Score:  (21 /50) (10-15 =excellent, 16-20=very good, 21-25 = good, 26-30 = fair, 30+ = poor)  The Voice-Related Quality of Life (V-RQOL) measure is a patient-reported outcome measure assessing voice-related problems. Patient reported difficulty speaking loudly, feeling anxious/frustrated due to voice, difficulty using the phone and needing to repeat  self.      SLP Education - 10/18/21 1851     Education Details course of therapy, voice conservation    Person(s) Educated Patient    Methods Explanation;Handout    Comprehension Verbalized understanding              SLP Short Term Goals - 10/18/21 1917       SLP SHORT TERM GOAL #1   Title Patient will report carryover of 3 vocal hygiene strategies.    Time 6    Period Weeks  Status New      SLP SHORT TERM GOAL #2   Title Pt will demonstrate abdominal breathing 80% accuracy for sentence level responses.    Time 6    Period Weeks    Status New      SLP SHORT TERM GOAL #3   Title Pt will demonstrate voice HEP with rare min cues.    Time 6    Period Weeks    Status New              SLP Long Term Goals - 10/18/21 1919       SLP LONG TERM GOAL #1   Title Patient will demonstrate adequate vocal quality/endurance using vocal techniques (resonant voice, abdominal breathing,etc) >80% accuracy over 15 minute conversation.    Time 12    Period Weeks    Status New    Target Date 01/15/22      SLP LONG TERM GOAL #2   Title Patient will report carryover of at least 5 vocal hygiene techniques.    Time 12    Period Weeks    Status New    Target Date 01/15/22      SLP LONG TERM GOAL #3   Title Patient will report improved voice-related quality of life as measured by VRQOL.    Time 12    Period Weeks    Status New    Target Date 01/15/22              Plan - 10/18/21 1904     Clinical Impression Statement Patient presents with moderate dysphonia characterized by hoarse, raspy and strained vocal quality, low vocal intensity, and reduced endurance/projection. She is noted to have primary chest-centered breathing pattern during conversation, and habitually speaks on residual capacity, with increased strain and worsening vocal quality at the end of breath groups. Patient reports she has limited how much and how long she talks on the phone with friends. She  reports frustration with vocal fatigue and unpredictability of her voice. She was highly stimulable for improvements in vocal quality, pitch, and loudness with diagnostic intervention to reduce laryngeal tension and improve breath support. I recommend skilled ST to train pt in vocal hygiene, abdominal breathing, and voice exercises to improve vocal quality, endurance and function for conversations with family and friends.    Speech Therapy Frequency 1x /week   pt prefers 1x per week to 2x per week (recommendation)   Duration 12 weeks    Treatment/Interventions SLP instruction and feedback;Functional tasks;Other (comment)   abdominal breathing, vocal hygiene, voice conservation, vocal tract relaxation, resonant voice therapy, vocal function exercises   Potential to Achieve Goals Good    SLP Home Exercise Plan see pt instructiions    Consulted and Agree with Plan of Care Patient             Patient will benefit from skilled therapeutic intervention in order to improve the following deficits and impairments:   Other voice and resonance disorders  Dysphonia    Problem List There are no problems to display for this patient.  Deneise Lever, MS, CCC-SLP Speech-Language Pathologist   Aliene Altes, [place] 10/18/2021, 7:21 PM  [place] MAIN [facility] SERVICES [address] [place], [place], [address] Phone: [phone]   Fax:  [phone]  Name: CYANI KALLSTROM MRN: [id number] Date of Birth: 10/12/1955

## 2021-10-19 ENCOUNTER — Ambulatory Visit: Payer: Medicare Other | Admitting: Speech Pathology

## 2021-10-25 ENCOUNTER — Ambulatory Visit: Payer: Medicare Other | Admitting: Speech Pathology

## 2021-10-25 ENCOUNTER — Other Ambulatory Visit: Payer: Self-pay

## 2021-10-25 DIAGNOSIS — R490 Dysphonia: Secondary | ICD-10-CM

## 2021-10-25 DIAGNOSIS — R498 Other voice and resonance disorders: Secondary | ICD-10-CM

## 2021-10-25 NOTE — Therapy (Signed)
[place] MAIN [facility] SERVICES [place], [place], [address] Phone: [phone]   Fax:  [phone]  Speech Language Pathology Treatment  Patient Details  Name: Chelsey Price MRN: [id number] Date of Birth: 1955/11/22 Referring Provider (SLP): Dr. Clyde Canterbury   Encounter Date: 10/25/2021   End of Session - 10/25/21 1249     Visit Number 2    Number of Visits 13    Date for SLP Re-Evaluation 01/15/22    Authorization Type UHC MCR    Authorization - Visit Number 2    Progress Note Due on Visit 10    SLP Start Time 1100    SLP Stop Time  1200    SLP Time Calculation (min) 60 min    Activity Tolerance Patient tolerated treatment well             Past Medical History:  Diagnosis Date   Abdominal adhesions    Asthma without status asthmaticus    Cataract of both eyes    High myopia    IBS (irritable bowel syndrome)    Low vision, both eyes    PONV (postoperative nausea and vomiting)    Retinal detachment     Past Surgical History:  Procedure Laterality Date   ABDOMINAL HYSTERECTOMY     COLONOSCOPY WITH PROPOFOL N/A 04/16/2018   Procedure: COLONOSCOPY WITH PROPOFOL;  Surgeon: Lollie Sails, MD;  Location: [facility] ENDOSCOPY;  Service: Endoscopy;  Laterality: N/A;   ESOPHAGOGASTRODUODENOSCOPY (EGD) WITH PROPOFOL N/A 04/16/2018   Procedure: ESOPHAGOGASTRODUODENOSCOPY (EGD) WITH PROPOFOL;  Surgeon: Lollie Sails, MD;  Location: [facility] ENDOSCOPY;  Service: Endoscopy;  Laterality: N/A;   EYE SURGERY     KNEE ARTHROSCOPY     PAROTIDECTOMY      There were no vitals filed for this visit.   Subjective Assessment - 10/25/21 1241     Subjective "I talked on the phone with my son for 2 hours and I was hoarse."    Currently in Pain? No/denies                   ADULT SLP TREATMENT - 10/25/21 1241       General Information   Behavior/Cognition Alert;Cooperative;Pleasant mood    HPI Patient is a 66 y.o.  female with past medical history including GERD, asthma, bronchitis, thyroid nodules referred by Dr. Clyde Canterbury for dysphonia. Laryngoscopy on 10/10/21 showed TVC clear and mobile with no nodules, erythema or edema. Pt reported hoarseness after cough/illness that lasted a few weeks. Recently started reflux medications. ENT recommended voice therapy for vocal cord deconditioning exacerbated by irritation and strain from recent cough.      Treatment Provided   Treatment provided Cognitive-Linquistic      Pain Assessment   Pain Assessment No/denies pain      Cognitive-Linquistic Treatment   Treatment focused on Voice;Patient/family/caregiver education    Skilled Treatment Educated pt on vocal hygiene and provided handout. Reviewed and highlighted areas where pt can improve her vocal hygiene (limit length of conversations and take breaks, use confidential voice/easy onsets vs hard glottal attacks, reduce tension, reduce speaking in noise). Provided handouts and instruction in home exercises to reduce laryngeal tension including neck stretches, tongue stretch, yawn. Pt returned demonstration with initial mod fading to rare min cues. Trained pt in abdominal breathing (AB), initial mod cues fading to occasional min A for AB in isolation, and progressed to airflow sound (/sh/) and hum with min-mod cues. Automatic speech tasks (  counting) with AB accuracy 80%, with mod cues for easy onset, breath support.      Assessment / Recommendations / Plan   Plan Continue with current plan of care      Progression Toward Goals   Progression toward goals Progressing toward goals              SLP Education - 10/25/21 1248     Education Details voice hygiene, abdominal breathing, laryngeal relaxation    Person(s) Educated Patient    Methods Explanation;Handout;Verbal cues    Comprehension Verbalized understanding;Need further instruction              SLP Short Term Goals - 10/18/21 1917       SLP  SHORT TERM GOAL #1   Title Patient will report carryover of 3 vocal hygiene strategies.    Time 6    Period Weeks    Status New      SLP SHORT TERM GOAL #2   Title Pt will demonstrate abdominal breathing 80% accuracy for sentence level responses.    Time 6    Period Weeks    Status New      SLP SHORT TERM GOAL #3   Title Pt will demonstrate voice HEP with rare min cues.    Time 6    Period Weeks    Status New              SLP Long Term Goals - 10/18/21 1919       SLP LONG TERM GOAL #1   Title Patient will demonstrate adequate vocal quality/endurance using vocal techniques (resonant voice, abdominal breathing,etc) >80% accuracy over 15 minute conversation.    Time 12    Period Weeks    Status New    Target Date 01/15/22      SLP LONG TERM GOAL #2   Title Patient will report carryover of at least 5 vocal hygiene techniques.    Time 12    Period Weeks    Status New    Target Date 01/15/22      SLP LONG TERM GOAL #3   Title Patient will report improved voice-related quality of life as measured by VRQOL.    Time 12    Period Weeks    Status New    Target Date 01/15/22              Plan - 10/25/21 1249     Clinical Impression Statement Patient presents with moderate dysphonia characterized by hoarse, raspy and strained vocal quality, low vocal intensity, and reduced endurance/projection. Continues with chest-centered breathing pattern, speaking on residual capacity during conversation, with increased strain and worsening vocal quality at the end of breath groups. Hard glottal attacks also noted in conversation. Pt was responsive to training in laryngeal relaxation and abdominal breathing, and was able to return demonstration for confidential voice; at end of session she reported feeling reduced strain. I recommend skilled ST to train pt in vocal hygiene, abdominal breathing, and voice exercises to improve vocal quality, endurance and function for conversations with  family and friends.    Speech Therapy Frequency 1x /week   pt prefers 1x per week to 2x per week (recommendation)   Duration 12 weeks    Treatment/Interventions SLP instruction and feedback;Functional tasks;Other (comment)   abdominal breathing, vocal hygiene, voice conservation, vocal tract relaxation, resonant voice therapy, vocal function exercises   Potential to Achieve Goals Good    SLP Home Exercise Plan abdominal breathing, laryngeal relaxation  provided    Consulted and Agree with Plan of Care Patient             Patient will benefit from skilled therapeutic intervention in order to improve the following deficits and impairments:   Other voice and resonance disorders  Dysphonia    Problem List There are no problems to display for this patient.  Deneise Lever, [place], CCC-SLP Speech-Language Pathologist [phone]  Aliene Altes, [place] 10/25/2021, 12:52 PM  [place] MAIN [facility] SERVICES [address] [place], [place], [address] Phone: [phone]   Fax:  [phone]   Name: Chelsey Price MRN: [id number] Date of Birth: 07/04/1956

## 2021-10-26 ENCOUNTER — Ambulatory Visit: Payer: Medicare Other | Admitting: Speech Pathology

## 2021-11-01 ENCOUNTER — Ambulatory Visit: Payer: Medicare Other | Attending: Otolaryngology | Admitting: Speech Pathology

## 2021-11-02 ENCOUNTER — Telehealth: Payer: Self-pay | Admitting: Speech Pathology

## 2021-11-02 NOTE — Telephone Encounter (Signed)
SLP placed call to pt re: no show for 2:00 appointment on 3/7. Patient returned call and left message stating she is out of town for a family emergency and unsure of when she will return. Pt states she will call back to update on when she is able to resume appointments.  ? ?Deneise Lever, MS, CCC-SLP ?Speech-Language Pathologist ?[phone] ?

## 2021-11-08 ENCOUNTER — Ambulatory Visit: Payer: Medicare Other | Admitting: Speech Pathology

## 2021-11-10 ENCOUNTER — Ambulatory Visit: Payer: Medicare Other | Admitting: Speech Pathology

## 2021-11-16 ENCOUNTER — Telehealth: Payer: Self-pay | Admitting: Oncology

## 2021-11-16 NOTE — Telephone Encounter (Signed)
Received referral for patient to see hematology. Patient is an established Chelsey Price patient (last seen 2020) per Dr. Zella Richer can see NP to re-establish care here at the cancer center. VM left and requested call back if she would like to get that set up.  ? ? ?

## 2021-11-17 ENCOUNTER — Ambulatory Visit: Payer: Medicare Other | Admitting: Speech Pathology

## 2021-11-23 ENCOUNTER — Ambulatory Visit: Payer: Medicare Other | Admitting: Dermatology

## 2021-11-24 ENCOUNTER — Ambulatory Visit: Payer: Medicare Other | Admitting: Speech Pathology

## 2021-11-29 ENCOUNTER — Ambulatory Visit: Payer: Medicare Other | Attending: Otolaryngology | Admitting: Speech Pathology

## 2021-11-29 ENCOUNTER — Telehealth: Payer: Self-pay | Admitting: Speech Pathology

## 2021-11-29 ENCOUNTER — Ambulatory Visit: Payer: Medicare Other | Admitting: Speech Pathology

## 2021-11-29 NOTE — Telephone Encounter (Signed)
Placed call to pt re: NS for 11/29/21 2pm appt, and informed of cancelled appt on 4/6 due to therapist absence. Requested callback if pt wishes to continue therapy by Monday 4/10. ? ?Deneise Lever, MS, CCC-SLP ?Speech-Language Pathologist ?[phone] ? ?

## 2021-12-01 ENCOUNTER — Ambulatory Visit: Payer: Medicare Other | Admitting: Speech Pathology

## 2021-12-01 ENCOUNTER — Encounter: Payer: Self-pay | Admitting: Medical Oncology

## 2021-12-01 ENCOUNTER — Inpatient Hospital Stay: Payer: Medicare Other | Attending: Oncology | Admitting: Medical Oncology

## 2021-12-01 VITALS — BP 125/80 | HR 88 | Temp 98.7°F | Resp 20 | Wt 170.5 lb

## 2021-12-01 DIAGNOSIS — Z806 Family history of leukemia: Secondary | ICD-10-CM | POA: Insufficient documentation

## 2021-12-01 DIAGNOSIS — K589 Irritable bowel syndrome without diarrhea: Secondary | ICD-10-CM | POA: Diagnosis not present

## 2021-12-01 DIAGNOSIS — Z79899 Other long term (current) drug therapy: Secondary | ICD-10-CM | POA: Diagnosis not present

## 2021-12-01 DIAGNOSIS — R894 Abnormal immunological findings in specimens from other organs, systems and tissues: Secondary | ICD-10-CM | POA: Diagnosis not present

## 2021-12-01 DIAGNOSIS — D649 Anemia, unspecified: Secondary | ICD-10-CM | POA: Diagnosis not present

## 2021-12-01 DIAGNOSIS — D519 Vitamin B12 deficiency anemia, unspecified: Secondary | ICD-10-CM | POA: Diagnosis present

## 2021-12-01 DIAGNOSIS — Z803 Family history of malignant neoplasm of breast: Secondary | ICD-10-CM | POA: Diagnosis not present

## 2021-12-01 DIAGNOSIS — D563 Thalassemia minor: Secondary | ICD-10-CM | POA: Diagnosis not present

## 2021-12-01 NOTE — Progress Notes (Signed)
?Hematology/Oncology Consult note ?[place] ?[phone] [phone] ? ? ?Patient Care Team: ?Gayland Curry, MD as PCP - General (Family Medicine) ? ?REFERRING PROVIDER: ?Gayland Curry, MD ?CHIEF COMPLAINTS/REASON FOR VISIT:  ?Evaluation of anemia ? ?HISTORY OF PRESENTING ILLNESS:  ?Chelsey Price is a  66 y.o.  female with PMH listed below who was referred to me for evaluation of anemia ?Reviewed patient's recent labs that was done at [facility] office.  08/06/2018 labs revealed anemia with hemoglobin of 11.2 ?Reviewed patient's previous labs ordered by primary care physician's office, anemia is chronic onset , duration is since June 2019 when her hemoglobin started to trend down to 11.9. ?She has occasionally low hemoglobin counts between 11-12 back in 2013 and 2015 but also reports that ever since she was a teenager she has been anemic. She reports a normal bone marrow biopsy when she was a teen for her anemia. She also reports that her mom had severe anemia with hemoglobin levels in the 3-4 range requiring multiple transfusion.  ?No aggravating or improving factors. ? ?Associated signs and symptoms: Patient reports fatigue.  Denies SOB with exertion.  ?No unintentional weight loss. No night sweats ?Denies hematochezia, hemoptysis, hematuria. ?Context: History of GI bleeding: Denies ?              History of Chronic kidney disease denies ?              History of autoimmune disease denies ?              History of hemolytic anemia.  ?              Remote history of anemia when patient was 66 years of age, had extensive work-up at that time at [facility].  Per patient, bone marrow was also obtained.  She was told that her red blood cells were distracted prematurely.  She did not follow-up later on. ? ?              Last colonoscopy:  ?04/16/2018 upper endoscopy  ? Z-line regular. Biopsied. ?- Gastritis. Biopsied. ?- A single  non-bleeding angioectasia in the stomach. ?- Mucosal variant in the duodenum. Biopsied. ?- Biopsies were taken with a cold forceps for histology in the gastric body ?Biopsy negative for H. pylori, dysplasia or malignancy. ? ?04/16/2018 colonoscopy ?Redundant colon. ?- Diverticulosis in the sigmoid colon, in the descending colon and in the transverse colon. ?- Biopsies were taken with a cold forceps from the left colon for evaluation of microscopic colitis. ?Biopsies are negative for malignancy, microscopic colitis, dysplasia ? ?She had a history of vitamin B12 deficiency, 02/06/2018 with a level of 192.  Patient has been getting parenteral vitamin B12 injections at the PCPs office once monthly.  Repeat vitamin B12 level 02/04/2021 showed level of 522.  ? ?INTERVAL HISTORY ?Chelsey Price is a 66 y.o. female who has above history reviewed by me today presents for follow up visit for management of anemia. At her last visit in 2019 she had a Normal blood smear, normal haptoglobin, LDH.  Normal multiple myeloma panel.  Normal TSH and CMP. Reticulocyte panel showed slightly low immature reticulocyte fraction. Likely slightly underproduction of bone marrow according to Dr. Tasia Catchings. ? ?Today she reports that her PCP has expressed concerns over her chronic anemia.  ?Problems and complaints are listed below: ?Reports no new complaints.  She has been getting parenteral vitamin B12 injections at her PCP's office once monthly.  ? ?  Review of Systems  ?Constitutional:  Positive for malaise/fatigue. Negative for chills, fever and weight loss.  ?HENT:  Negative for sore throat.   ?Eyes:  Negative for redness.  ?Respiratory:  Negative for cough, shortness of breath and wheezing.   ?Cardiovascular:  Negative for chest pain, palpitations and leg swelling.  ?Gastrointestinal:  Negative for abdominal pain, blood in stool, nausea and vomiting.  ?Genitourinary:  Negative for dysuria.  ?Musculoskeletal:  Negative for myalgias.  ?Skin:   Negative for rash.  ?Neurological:  Negative for dizziness, tingling and tremors.  ?Endo/Heme/Allergies:  Bruises/bleeds easily.  ?Psychiatric/Behavioral:  Negative for hallucinations.   ? ?MEDICAL HISTORY:  ?Past Medical History:  ?Diagnosis Date  ? Abdominal adhesions   ? Asthma without status asthmaticus   ? Cataract of both eyes   ? High myopia   ? IBS (irritable bowel syndrome)   ? Low vision, both eyes   ? PONV (postoperative nausea and vomiting)   ? Retinal detachment   ? ? ?SURGICAL HISTORY: ?Past Surgical History:  ?Procedure Laterality Date  ? ABDOMINAL HYSTERECTOMY    ? COLONOSCOPY WITH PROPOFOL N/A 04/16/2018  ? Procedure: COLONOSCOPY WITH PROPOFOL;  Surgeon: Lollie Sails, MD;  Location: [facility] ENDOSCOPY;  Service: Endoscopy;  Laterality: N/A;  ? ESOPHAGOGASTRODUODENOSCOPY (EGD) WITH PROPOFOL N/A 04/16/2018  ? Procedure: ESOPHAGOGASTRODUODENOSCOPY (EGD) WITH PROPOFOL;  Surgeon: Lollie Sails, MD;  Location: [facility] ENDOSCOPY;  Service: Endoscopy;  Laterality: N/A;  ? EYE SURGERY    ? KNEE ARTHROSCOPY    ? PAROTIDECTOMY    ? ? ?SOCIAL HISTORY: ?Social History  ? ?Socioeconomic History  ? Marital status: Widowed  ?  Spouse name: Not on file  ? Number of children: Not on file  ? Years of education: Not on file  ? Highest education level: Not on file  ?Occupational History  ? Not on file  ?Tobacco Use  ? Smoking status: Never  ? Smokeless tobacco: Never  ?Vaping Use  ? Vaping Use: Never used  ?Substance and Sexual Activity  ? Alcohol use: Never  ? Drug use: Not Currently  ? Sexual activity: Not Currently  ?Other Topics Concern  ? Not on file  ?Social History Narrative  ? Not on file  ? ?Social Determinants of Health  ? ?Financial Resource Strain: Not on file  ?Food Insecurity: Not on file  ?Transportation Needs: Not on file  ?Physical Activity: Not on file  ?Stress: Not on file  ?Social Connections: Not on file  ?Intimate Partner Violence: Not on file  ? ? ?FAMILY HISTORY: ?Family History  ?Problem  Relation Age of Onset  ? Heart disease Father   ? Dementia Father   ? Diabetes Sister   ? Lymphoma Maternal Uncle   ? Breast cancer Maternal Grandmother   ? Thyroid disease Sister   ? Leukemia Cousin   ? ? ?ALLERGIES:  is allergic to iodine, shellfish allergy, sulfa antibiotics, and tomato. ? ?MEDICATIONS:  ?Current Outpatient Medications  ?Medication Sig Dispense Refill  ? albuterol (PROVENTIL HFA;VENTOLIN HFA) 108 (90 Base) MCG/ACT inhaler Inhale 2 puffs into the lungs every 4 (four) hours as needed for wheezing or shortness of breath.    ? betamethasone dipropionate 0.05 % lotion Apply topically as needed.    ? cyanocobalamin (,VITAMIN B-12,) 1000 MCG/ML injection Inject 1,000 mcg into the muscle every 30 (thirty) days.    ? dicyclomine (BENTYL) 10 MG capsule Take 10 mg by mouth 4 (four) times daily -  before meals and at bedtime.    ?  famotidine (PEPCID) 20 MG tablet Take 20 mg by mouth 2 (two) times daily.    ? fluticasone (FLONASE) 50 MCG/ACT nasal spray Place 2 sprays into both nostrils daily.    ? Fluticasone-Salmeterol (ADVAIR) 100-50 MCG/DOSE AEPB Inhale 1 puff into the lungs 2 (two) times daily.    ? ibuprofen (ADVIL,MOTRIN) 600 MG tablet Take 600 mg by mouth every 6 (six) hours.    ? levothyroxine (SYNTHROID) 25 MCG tablet Take by mouth.    ? montelukast (SINGULAIR) 10 MG tablet Take 10 mg by mouth at bedtime.    ? Nebulizers (INNOSPIRE ELEGANCE NEBULIZER) MISC See admin instructions.    ? polyethylene glycol (MIRALAX / GLYCOLAX) packet Take by mouth. Take 17 g by mouth once daily as needed. Mix in 4-8ounces of fluid prior to taking. Pt reports she for IBS flare    ? rosuvastatin (CRESTOR) 5 MG tablet Take by mouth.    ? pantoprazole (PROTONIX) 40 MG tablet Take 40 mg by mouth daily.  (Patient not taking: Reported on 12/01/2021)    ? ?No current facility-administered medications for this visit.  ? ? ? ?PHYSICAL EXAMINATION: ?ECOG PERFORMANCE STATUS: 1 - Symptomatic but completely ambulatory ?Vitals:  ?  12/01/21 1431  ?BP: 125/80  ?Pulse: 88  ?Resp: 20  ?Temp: 98.7 ?F (37.1 ?C)  ?SpO2: 100%  ? ?Filed Weights  ? 12/01/21 1431  ?Weight: 170 lb 8 oz (77.3 kg)  ? ? ?Physical Exam ?Constitutional:   ?   General: She is not in ac

## 2021-12-05 ENCOUNTER — Ambulatory Visit: Payer: Medicare Other | Admitting: Speech Pathology

## 2021-12-06 ENCOUNTER — Ambulatory Visit: Payer: Medicare Other | Admitting: Speech Pathology

## 2021-12-07 ENCOUNTER — Ambulatory Visit: Payer: Medicare Other | Admitting: Speech Pathology

## 2021-12-08 ENCOUNTER — Other Ambulatory Visit: Payer: Self-pay

## 2021-12-08 ENCOUNTER — Inpatient Hospital Stay: Payer: Medicare Other

## 2021-12-08 DIAGNOSIS — D649 Anemia, unspecified: Secondary | ICD-10-CM

## 2021-12-08 DIAGNOSIS — D519 Vitamin B12 deficiency anemia, unspecified: Secondary | ICD-10-CM | POA: Diagnosis not present

## 2021-12-08 LAB — CBC WITH DIFFERENTIAL/PLATELET
Abs Immature Granulocytes: 0.02 10*3/uL (ref 0.00–0.07)
Basophils Absolute: 0 10*3/uL (ref 0.0–0.1)
Basophils Relative: 1 %
Eosinophils Absolute: 0.1 10*3/uL (ref 0.0–0.5)
Eosinophils Relative: 2 %
HCT: 39.2 % (ref 36.0–46.0)
Hemoglobin: 12.1 g/dL (ref 12.0–15.0)
Immature Granulocytes: 0 %
Lymphocytes Relative: 36 %
Lymphs Abs: 1.8 10*3/uL (ref 0.7–4.0)
MCH: 26.5 pg (ref 26.0–34.0)
MCHC: 30.9 g/dL (ref 30.0–36.0)
MCV: 85.8 fL (ref 80.0–100.0)
Monocytes Absolute: 0.3 10*3/uL (ref 0.1–1.0)
Monocytes Relative: 7 %
Neutro Abs: 2.6 10*3/uL (ref 1.7–7.7)
Neutrophils Relative %: 54 %
Platelets: 242 10*3/uL (ref 150–400)
RBC: 4.57 MIL/uL (ref 3.87–5.11)
RDW: 13.6 % (ref 11.5–15.5)
WBC: 4.8 10*3/uL (ref 4.0–10.5)
nRBC: 0 % (ref 0.0–0.2)

## 2021-12-08 LAB — COMPREHENSIVE METABOLIC PANEL
ALT: 31 U/L (ref 0–44)
AST: 25 U/L (ref 15–41)
Albumin: 4.5 g/dL (ref 3.5–5.0)
Alkaline Phosphatase: 61 U/L (ref 38–126)
Anion gap: 6 (ref 5–15)
BUN: 17 mg/dL (ref 8–23)
CO2: 29 mmol/L (ref 22–32)
Calcium: 9.4 mg/dL (ref 8.9–10.3)
Chloride: 103 mmol/L (ref 98–111)
Creatinine, Ser: 0.98 mg/dL (ref 0.44–1.00)
GFR, Estimated: >60 mL/min (ref >60)
Glucose, Bld: 91 mg/dL (ref 70–99)
Potassium: 4.2 mmol/L (ref 3.5–5.1)
Sodium: 138 mmol/L (ref 135–145)
Total Bilirubin: 0.5 mg/dL (ref 0.3–1.2)
Total Protein: 7.4 g/dL (ref 6.5–8.1)

## 2021-12-08 LAB — TECHNOLOGIST SMEAR REVIEW: Plt Morphology: ADEQUATE

## 2021-12-08 LAB — RETIC PANEL
Immature Retic Fract: 3.1 % (ref 2.3–15.9)
RBC.: 4.52 MIL/uL (ref 3.87–5.11)
Retic Count, Absolute: 57 10*3/uL (ref 19.0–186.0)
Retic Ct Pct: 1.3 % (ref 0.4–3.1)
Reticulocyte Hemoglobin: 30.5 pg (ref >27.9)

## 2021-12-08 LAB — TSH: TSH: 0.899 u[IU]/mL (ref 0.350–4.500)

## 2021-12-08 LAB — LACTATE DEHYDROGENASE: LDH: 167 U/L (ref 98–192)

## 2021-12-09 LAB — KAPPA/LAMBDA LIGHT CHAINS
Kappa free light chain: 22.6 mg/L — ABNORMAL HIGH (ref 3.3–19.4)
Kappa, lambda light chain ratio: 2.11 — ABNORMAL HIGH (ref 0.26–1.65)
Lambda free light chains: 10.7 mg/L (ref 5.7–26.3)

## 2021-12-09 LAB — HAPTOGLOBIN: Haptoglobin: 186 mg/dL (ref 37–355)

## 2021-12-10 LAB — HGB FRACTIONATION CASCADE
Hgb A2: 2.7 % (ref 1.8–3.2)
Hgb A: 97.3 % (ref 96.4–98.8)
Hgb F: 0 % (ref 0.0–2.0)
Hgb S: 0 %

## 2021-12-12 ENCOUNTER — Ambulatory Visit (INDEPENDENT_AMBULATORY_CARE_PROVIDER_SITE_OTHER): Payer: Medicare Other | Admitting: Dermatology

## 2021-12-12 DIAGNOSIS — L720 Epidermal cyst: Secondary | ICD-10-CM

## 2021-12-12 LAB — MULTIPLE MYELOMA PANEL, SERUM
Albumin SerPl Elph-Mcnc: 4.3 g/dL (ref 2.9–4.4)
Albumin/Glob SerPl: 1.6 (ref 0.7–1.7)
Alpha 1: 0.2 g/dL (ref 0.0–0.4)
Alpha2 Glob SerPl Elph-Mcnc: 0.7 g/dL (ref 0.4–1.0)
B-Globulin SerPl Elph-Mcnc: 1 g/dL (ref 0.7–1.3)
Gamma Glob SerPl Elph-Mcnc: 0.9 g/dL (ref 0.4–1.8)
Globulin, Total: 2.8 g/dL (ref 2.2–3.9)
IgA: 144 mg/dL (ref 87–352)
IgG (Immunoglobin G), Serum: 872 mg/dL (ref 586–1602)
IgM (Immunoglobulin M), Srm: 82 mg/dL (ref 26–217)
Total Protein ELP: 7.1 g/dL (ref 6.0–8.5)

## 2021-12-12 NOTE — Progress Notes (Signed)
? ?  New Patient Visit ? ?Subjective  ?Chelsey Price is a 66 y.o. female who presents for the following: Other (Knot on chest for a couple years.  ? ?The following portions of the chart were reviewed this encounter and updated as appropriate:  ? Tobacco  Allergies  Meds  Problems  Med Hx  Surg Hx  Fam Hx   ?  ?Review of Systems:  No other skin or systemic complaints except as noted in HPI or Assessment and Plan. ? ?Objective  ?Well appearing patient in no apparent distress; mood and affect are within normal limits. ? ?A focused examination was performed including chest. Relevant physical exam findings are noted in the Assessment and Plan. ? ?Right chest ?Subcutaneous nodule 1.1 cm ? ? ?Assessment & Plan  ?Epidermal inclusion cyst ?Right chest ?Discussed risk of rupture/abscess. Discussed excision. Patient will schedule surgery appointment. Discussed risk of scar/keloid. ?Benign-appearing. Exam most consistent with an epidermal inclusion cyst. Discussed that a cyst is a benign growth that can grow over time and sometimes get irritated or inflamed. Recommend observation if it is not bothersome. Discussed option of surgical excision to remove it if it is growing, symptomatic, or other changes noted. Please call for new or changing lesions so they can be evaluated. ? ?Return for Surgery for cyst of right chest. ? ?I, Ashok Cordia, CMA, am acting as scribe for Sarina Ser, MD . ?Documentation: I have reviewed the above documentation for accuracy and completeness, and I agree with the above. ? ?Sarina Ser, MD ? ?

## 2021-12-12 NOTE — Patient Instructions (Signed)
? ?PRE-OPERATIVE INSTRUCTIONS ? ?We recommend you read the following instructions. If you have any questions or concerns, please call the office at [phone]. ? ?Shower and wash the entire body with soap and water the day of your surgery paying special attention to cleansing at and around the planned surgery site. ?Avoid aspirin or aspirin containing products at least ten (10) days prior to your surgical procedure and for at least one week (7 days) after your surgical procedure. If you take aspirin on a regular basis for heart disease or history of stroke or for any other reason, we may recommend you continue taking aspirin but please notify us if you take this on a regular basis. Aspirin can cause more bleeding to occur during surgery as well as prolonged bleeding and bruising after surgery. ?Avoid other nonsteroidal pain medications at least one week prior to surgery and at least one week after your surgery. These include medications such as Ibuprofen (Motrin, Advil and Nuprin), Naprosyn, Voltaren, Relafen, etc. If these medications are used for therapeutic reasons, please inform us as they can cause increased bleeding or prolonged bleeding during and bruising after surgical procedures.  ?Please advise [place] if you are taking any ?blood thinner? medications such as Coumadin or Dipyridamole or Plavix or similar medications. These cause increased bleeding and prolonged bleeding during procedures and bruising after surgical procedures. We may have to consider discontinuing these medications briefly prior to and shortly after your surgery if safe to do so. ?Please inform us of all medications you are currently taking. All medications that are taken regularly should be taken the day of surgery as you always do. Nevertheless, we need to be informed of what medications you are taking prior to surgery to know whether they will affect the procedure or cause any complications. ?Please inform us of any medication allergies.  Also inform us of whether you have allergies to Latex or rubber products or whether you have had any adverse reaction to Lidocaine or Epinephrine. ?Please inform us of any prosthetic or artificial body parts such as artificial heart valve, joint replacements, etc., or similar condition that might require preoperative antibiotics. ?We recommend avoidance of alcohol at least two weeks prior to surgery and continued avoidance for at least two weeks after surgery. ?We recommend avoidance of tobacco smoking at least two weeks prior to surgery and continued abstinence for at least two weeks after surgery. ?Do not plan strenuous exercise, strenuous work or strenuous lifting for approximately four weeks after your surgery. ?We request if you are unable to make your scheduled surgical appointment, please call us at least a week in advance or as soon as you are aware of a problem so that we can cancel or reschedule the appointment. ?You MAY TAKE TYLENOL (acetaminophen) for pain as it is not a blood thinner. ?PLEASE PLAN TO BE IN TOWN FOR TWO WEEKS FOLLOWING SURGERY. THIS IS IMPORTANT SO YOU CAN BE CHECKED FOR DRESSING CHANGES, SUTURE REMOVAL AND TO MONITOR FOR POSSIBLE COMPLICATIONS. ? ? ? ? ? ?f You Need Anything After Your Visit ? ?If you have any questions or concerns for your doctor, please call our main line at [phone] and press option 4 to reach your doctor's medical assistant. If no one answers, please leave a voicemail as directed and we will return your call as soon as possible. Messages left after 4 pm will be answered the following business day.  ? ?You may also send [place] a message via MyChart. We typically respond to  MyChart messages within 1-2 business days. ? ?For prescription refills, please ask your pharmacy to contact our office. Our fax number is [phone]. ? ?If you have an urgent issue when the clinic is closed that cannot wait until the next business day, you can page your doctor at the number  below.   ? ?Please note that while we do our best to be available for urgent issues outside of office hours, we are not available 24/7.  ? ?If you have an urgent issue and are unable to reach [place], you may choose to seek medical care at your doctor's office, retail clinic, urgent care center, or emergency room. ? ?If you have a medical emergency, please immediately call 911 or go to the emergency department. ? ?Pager Numbers ? ?- Dr. Nehemiah Massed: [phone] ? ?- Dr. Laurence Ferrari: [phone] ? ?- Dr. Nicole Kindred: [phone] ? ?In the event of inclement weather, please call our main line at [phone] for an update on the status of any delays or closures. ? ?Dermatology Medication Tips: ?Please keep the boxes that topical medications come in in order to help keep track of the instructions about where and how to use these. Pharmacies typically print the medication instructions only on the boxes and not directly on the medication tubes.  ? ?If your medication is too expensive, please contact our office at [phone] option 4 or send [place] a message through [place].  ? ?We are unable to tell what your co-pay for medications will be in advance as this is different depending on your insurance coverage. However, we may be able to find a substitute medication at lower cost or fill out paperwork to get insurance to cover a needed medication.  ? ?If a prior authorization is required to get your medication covered by your insurance company, please allow [place] 1-2 business days to complete this process. ? ?Drug prices often vary depending on where the prescription is filled and some pharmacies may offer cheaper prices. ? ?The website www.goodrx.com contains coupons for medications through different pharmacies. The prices here do not account for what the cost may be with help from insurance (it may be cheaper with your insurance), but the website can give you the price if you did not use any insurance.  ?- You can print the associated coupon  and take it with your prescription to the pharmacy.  ?- You may also stop by our office during regular business hours and pick up a GoodRx coupon card.  ?- If you need your prescription sent electronically to a different pharmacy, notify our office through [facility] or by phone at [phone] option 4. ? ? ? ? ?Si Usted Necesita Algo Despu?s de Su Visita ? ?Tambi?n puede enviarnos un mensaje a trav?s de MyChart. Por lo general respondemos a los mensajes de MyChart en el transcurso de 1 a 2 d?as h?biles. ? ?Para renovar recetas, por favor pida a su farmacia que se ponga en contacto con nuestra oficina. Nuestro n?mero de fax es el [phone]. ? ?Si tiene un asunto urgente cuando la cl?nica est? cerrada y que no puede esperar hasta el siguiente d?a h?bil, puede llamar/localizar a su doctor(a) al n?mero que aparece a continuaci?n.  ? ?Por favor, tenga en cuenta que aunque hacemos todo lo posible para estar disponibles para asuntos urgentes fuera del horario de oficina, no estamos disponibles las 24 horas del d?a, los 7 d?as de la semana.  ? ?Si tiene un problema urgente y no puede comunicarse con nosotros, puede  optar por buscar atenci?n m?dica  en el consultorio de su doctor(a), en una cl?nica privada, en un centro de atenci?n urgente o en una sala de emergencias. ? ?Si tiene Engineer, maintenance (IT) m?dica, por favor llame inmediatamente al 911 o vaya a la sala de emergencias. ? ?N?meros de b?per ? ?- Dr. Nehemiah Massed: [phone] ? ?- Dra. Moye: [phone] ? ?- Dra. Nicole Kindred: [phone] ? ?En caso de inclemencias del tiempo, por favor llame a nuestra l?nea principal al [phone] para una actualizaci?n sobre el estado de cualquier retraso o cierre. ? ?Consejos para la medicaci?n en dermatolog?a: ?Por favor, guarde las cajas en las que vienen los medicamentos de uso t?pico para ayudarle a seguir las instrucciones sobre d?nde y c?mo usarlos. Las farmacias generalmente imprimen las instrucciones del medicamento  s?lo en las cajas y no directamente en los tubos del [place].  ? ?Si su medicamento es muy caro, por favor, p?ngase en contacto con Zigmund Daniel llamando al [phone] y presione la opci?n 4 o env?e

## 2021-12-13 ENCOUNTER — Ambulatory Visit: Payer: Medicare Other | Admitting: Speech Pathology

## 2021-12-13 ENCOUNTER — Encounter: Payer: Medicare Other | Admitting: Speech Pathology

## 2021-12-15 ENCOUNTER — Inpatient Hospital Stay (HOSPITAL_BASED_OUTPATIENT_CLINIC_OR_DEPARTMENT_OTHER): Payer: Medicare Other | Admitting: Oncology

## 2021-12-15 ENCOUNTER — Encounter: Payer: Self-pay | Admitting: Oncology

## 2021-12-15 ENCOUNTER — Ambulatory Visit: Payer: Medicare Other | Admitting: Speech Pathology

## 2021-12-15 DIAGNOSIS — R768 Other specified abnormal immunological findings in serum: Secondary | ICD-10-CM

## 2021-12-15 DIAGNOSIS — D649 Anemia, unspecified: Secondary | ICD-10-CM

## 2021-12-15 DIAGNOSIS — D563 Thalassemia minor: Secondary | ICD-10-CM

## 2021-12-15 DIAGNOSIS — D519 Vitamin B12 deficiency anemia, unspecified: Secondary | ICD-10-CM | POA: Diagnosis not present

## 2021-12-15 NOTE — Progress Notes (Addendum)
HEMATOLOGY-ONCOLOGY TeleHEALTH VISIT PROGRESS NOTE  I connected with Chelsey Price on 12/15/21  at  2:45 PM EDT by video enabled telemedicine visit and verified that I am speaking with the correct person using two identifiers. I discussed the limitations, risks, security and privacy concerns of performing an evaluation and management service by telemedicine and the availability of in-person appointments. The patient expressed understanding and agreed to proceed.   Other persons participating in the visit and their role in the encounter:  None  Patient's location: Home  Provider's location: office Chief Complaint:, Anemia   INTERVAL HISTORY Chelsey Price is a 66 y.o. female who has above history reviewed by me today presents for follow up visit for management of anemia  Patient reestablish care on 12/01/2021.  She was referred back due to anemia.  She was seen by NP Nelwyn Salisbury.  Had blood work done.  Patient presents virtually to discuss results. Patient has no new complaints.   Review of Systems  Constitutional:  Negative for appetite change, chills, fatigue and fever.  HENT:   Negative for hearing loss and voice change.   Eyes:  Negative for eye problems.  Respiratory:  Negative for chest tightness and cough.   Cardiovascular:  Negative for chest pain.  Gastrointestinal:  Negative for abdominal distention, abdominal pain and blood in stool.  Endocrine: Negative for hot flashes.  Genitourinary:  Negative for difficulty urinating and frequency.   Musculoskeletal:  Negative for arthralgias.  Skin:  Negative for itching and rash.  Neurological:  Negative for extremity weakness.  Hematological:  Negative for adenopathy.  Psychiatric/Behavioral:  Negative for confusion.    Past Medical History:  Diagnosis Date   Abdominal adhesions    Asthma without status asthmaticus    Cataract of both eyes    High myopia    IBS (irritable bowel syndrome)    Low vision, both eyes     PONV (postoperative nausea and vomiting)    Retinal detachment    Past Surgical History:  Procedure Laterality Date   ABDOMINAL HYSTERECTOMY     COLONOSCOPY WITH PROPOFOL N/A 04/16/2018   Procedure: COLONOSCOPY WITH PROPOFOL;  Surgeon: Lollie Sails, MD;  Location: [facility] ENDOSCOPY;  Service: Endoscopy;  Laterality: N/A;   ESOPHAGOGASTRODUODENOSCOPY (EGD) WITH PROPOFOL N/A 04/16/2018   Procedure: ESOPHAGOGASTRODUODENOSCOPY (EGD) WITH PROPOFOL;  Surgeon: Lollie Sails, MD;  Location: [facility] ENDOSCOPY;  Service: Endoscopy;  Laterality: N/A;   EYE SURGERY     KNEE ARTHROSCOPY     PAROTIDECTOMY      Family History  Problem Relation Age of Onset   Heart disease Father    Dementia Father    Diabetes Sister    Lymphoma Maternal Uncle    Breast cancer Maternal Grandmother    Thyroid disease Sister    Leukemia Cousin     Social History   Socioeconomic History   Marital status: Widowed    Spouse name: Not on file   Number of children: Not on file   Years of education: Not on file   Highest education level: Not on file  Occupational History   Not on file  Tobacco Use   Smoking status: Never   Smokeless tobacco: Never  Vaping Use   Vaping Use: Never used  Substance and Sexual Activity   Alcohol use: Never   Drug use: Not Currently   Sexual activity: Not Currently  Other Topics Concern   Not on file  Social History Narrative   Not on file  Social Determinants of Health   Financial Resource Strain: Not on file  Food Insecurity: Not on file  Transportation Needs: Not on file  Physical Activity: Not on file  Stress: Not on file  Social Connections: Not on file  Intimate Partner Violence: Not on file    Current Outpatient Medications on File Prior to Visit  Medication Sig Dispense Refill   albuterol (PROVENTIL HFA;VENTOLIN HFA) 108 (90 Base) MCG/ACT inhaler Inhale 2 puffs into the lungs every 4 (four) hours as needed for wheezing or shortness of breath.      betamethasone dipropionate 0.05 % lotion Apply topically as needed.     cyanocobalamin (,VITAMIN B-12,) 1000 MCG/ML injection Inject 1,000 mcg into the muscle every 30 (thirty) days.     dicyclomine (BENTYL) 10 MG capsule Take 10 mg by mouth 4 (four) times daily -  before meals and at bedtime.     famotidine (PEPCID) 20 MG tablet Take 20 mg by mouth 2 (two) times daily.     fluticasone (FLONASE) 50 MCG/ACT nasal spray Place 2 sprays into both nostrils daily.     Fluticasone-Salmeterol (ADVAIR) 100-50 MCG/DOSE AEPB Inhale 1 puff into the lungs 2 (two) times daily.     ibuprofen (ADVIL,MOTRIN) 600 MG tablet Take 600 mg by mouth every 6 (six) hours.     levothyroxine (SYNTHROID) 25 MCG tablet Take by mouth.     montelukast (SINGULAIR) 10 MG tablet Take 10 mg by mouth at bedtime.     Nebulizers (INNOSPIRE ELEGANCE NEBULIZER) MISC See admin instructions.     polyethylene glycol (MIRALAX / GLYCOLAX) packet Take by mouth. Take 17 g by mouth once daily as needed. Mix in 4-8ounces of fluid prior to taking. Pt reports she for IBS flare     rosuvastatin (CRESTOR) 5 MG tablet Take by mouth.     pantoprazole (PROTONIX) 40 MG tablet Take 40 mg by mouth daily.  (Patient not taking: Reported on 12/01/2021)     No current facility-administered medications on file prior to visit.    Allergies  Allergen Reactions   Iodine    Shellfish Allergy    Sulfa Antibiotics    Tomato        Observations/Objective: Today's Vitals   12/15/21 1443  PainSc: 0-No pain   There is no height or weight on file to calculate BMI.  Physical Exam Neurological:     Mental Status: She is alert.    CBC    Component Value Date/Time   WBC 4.8 12/08/2021 1152   RBC 4.52 12/08/2021 1152   RBC 4.57 12/08/2021 1152   HGB 12.1 12/08/2021 1152   HCT 39.2 12/08/2021 1152   PLT 242 12/08/2021 1152   MCV 85.8 12/08/2021 1152   MCH 26.5 12/08/2021 1152   MCHC 30.9 12/08/2021 1152   RDW 13.6 12/08/2021 1152   LYMPHSABS 1.8  12/08/2021 1152   MONOABS 0.3 12/08/2021 1152   EOSABS 0.1 12/08/2021 1152   BASOSABS 0.0 12/08/2021 1152    CMP     Component Value Date/Time   NA 138 12/08/2021 1152   K 4.2 12/08/2021 1152   CL 103 12/08/2021 1152   CO2 29 12/08/2021 1152   GLUCOSE 91 12/08/2021 1152   BUN 17 12/08/2021 1152   CREATININE 0.98 12/08/2021 1152   CALCIUM 9.4 12/08/2021 1152   PROT 7.4 12/08/2021 1152   ALBUMIN 4.5 12/08/2021 1152   AST 25 12/08/2021 1152   ALT 31 12/08/2021 1152   ALKPHOS 61 12/08/2021 1152  BILITOT 0.5 12/08/2021 1152   GFRNONAA >60 12/08/2021 1152   GFRAA >60 09/03/2018 1015     Assessment and Plan: 1. Elevated serum immunoglobulin free light chains   2. Anemia, unspecified type   3. Alpha thalassemia trait     #History of anemia, 12/08/2021, CBC showed normal hemoglobin of 12.9, MCV 85.8. Normal TSH, normal haptoglobin.  Normal LDH.  Normal hemoglobinopathy electrophoresis pattern.  Alpha-+-thalassemia trait, no need for treatment. Recommend first degree family member to be screened when planning family.   #Elevated light chain ratio.  Nonspecific.  I recommend patient to have 24-hour urine protein electrophoresis for further evaluation. 24 hour UPEP negative. Hold additional work up.   Follow Up Instructions: No need to follow up .    I discussed the assessment and treatment plan with the patient. The patient was provided an opportunity to ask questions and all were answered. The patient agreed with the plan and demonstrated an understanding of the instructions.  The patient was advised to call back or seek an in-person evaluation if the symptoms worsen or if the condition fails to improve as anticipated.   Earlie Server, MD 12/15/2021 8:21 PM

## 2021-12-17 IMAGING — US US PELVIS COMPLETE
1 series · 14 of 25 positions shown · non-contrast
Comparison: None

CLINICAL DATA: Pelvic pain, vaginal wall thinning

EXAM:
TRANSABDOMINAL ULTRASOUND OF PELVIS
TECHNIQUE: Transabdominal ultrasound examination of the pelvis was performed
including evaluation of the uterus, ovaries, adnexal regions, and
pelvic cul-de-sac.

[Series 1: us pelvis complete · 0.15mm/px · 14 of 44 slices shown]
[im 1/44]
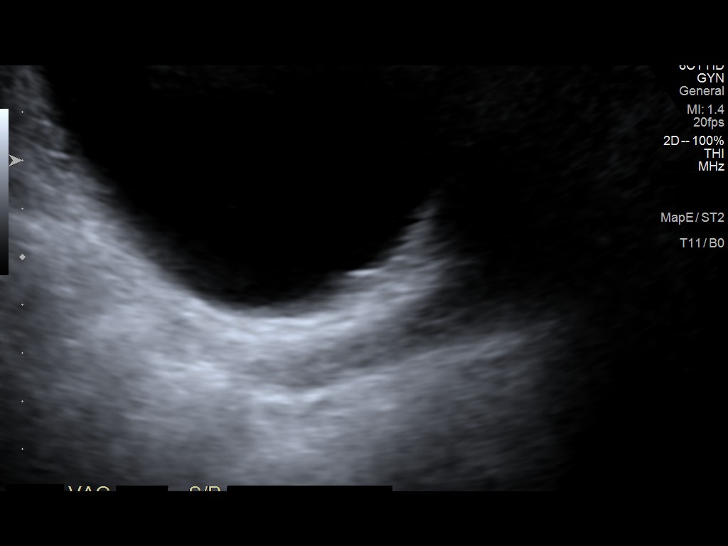
[im 4/44]
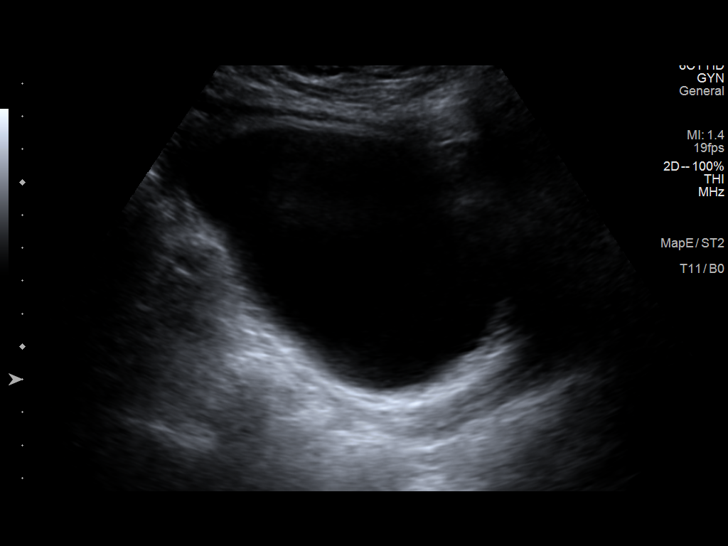
[im 8/44]
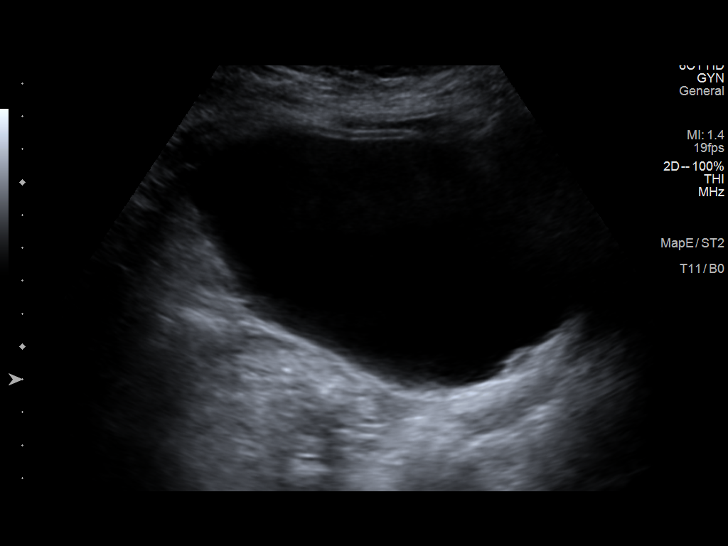
[im 11/44]
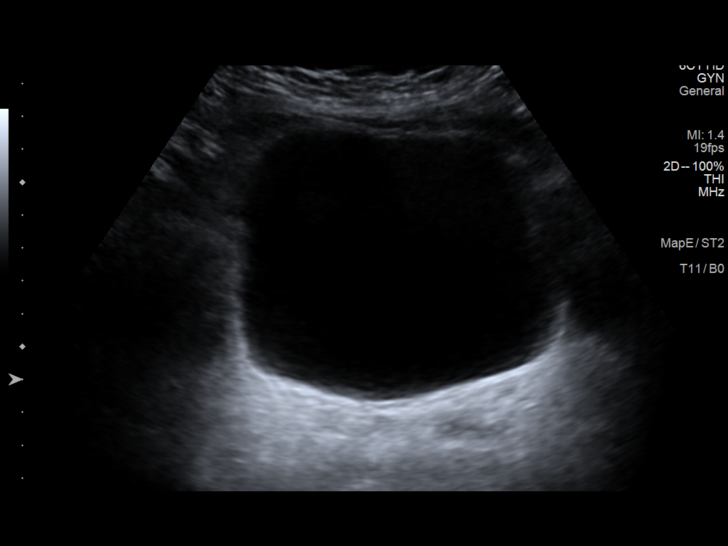
[im 15/44]
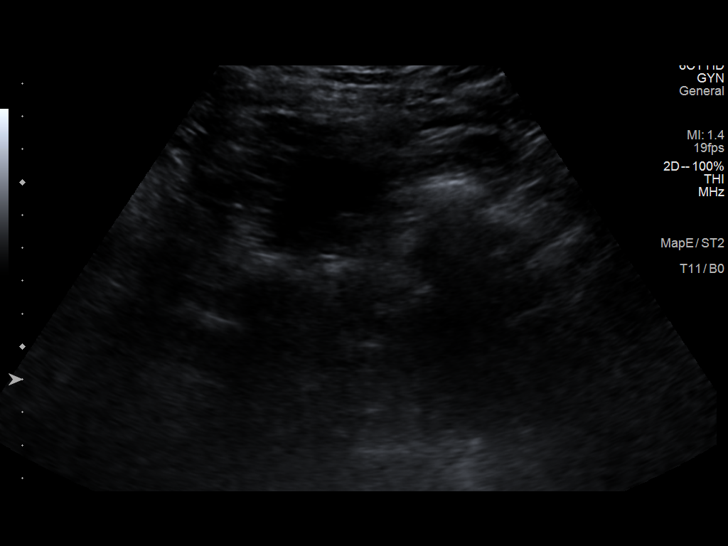
[im 17/44]
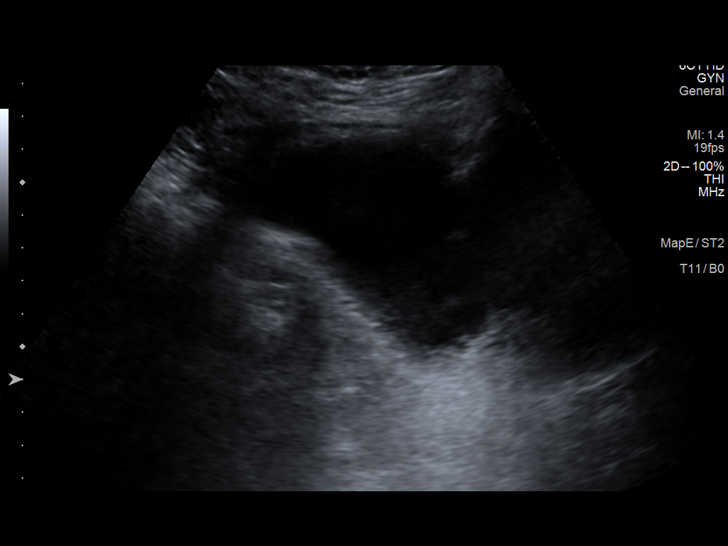
[im 20/44]
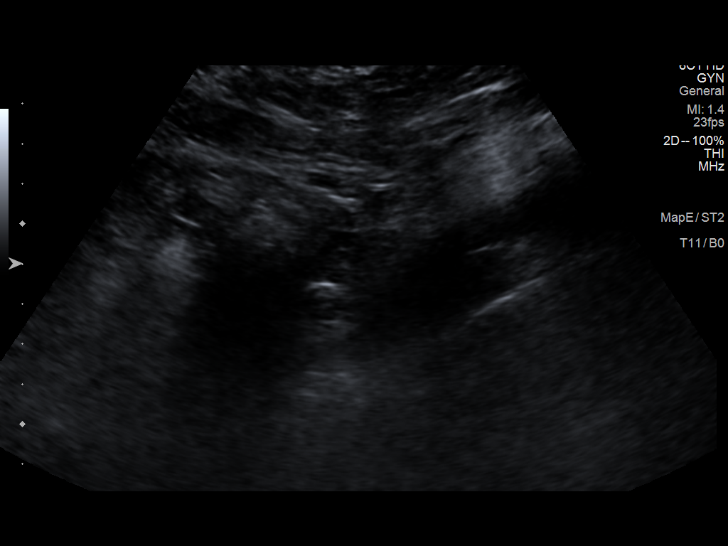
[im 24/44]
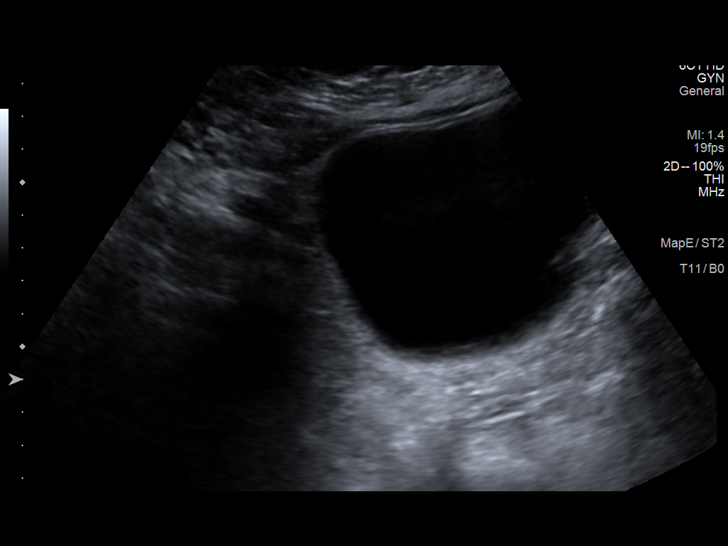
[im 27/44]
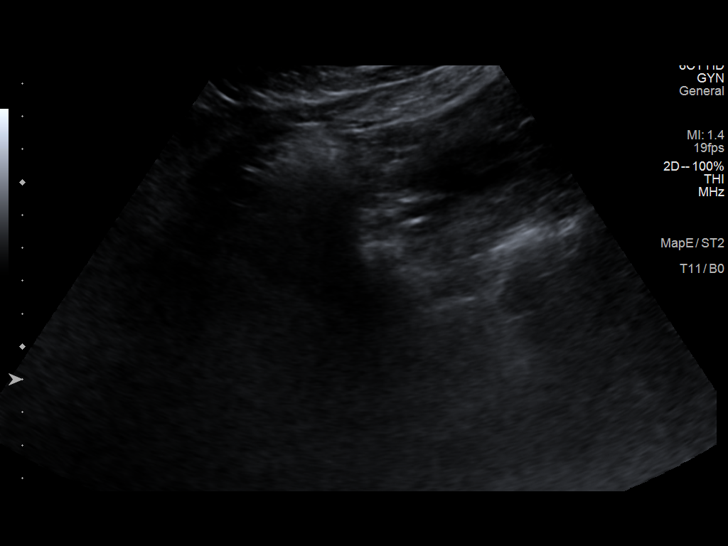
[im 29/44]
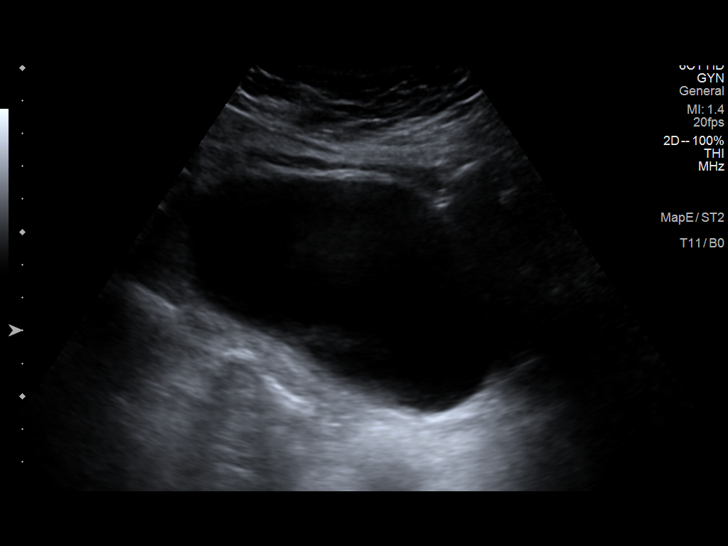
[im 33/44]
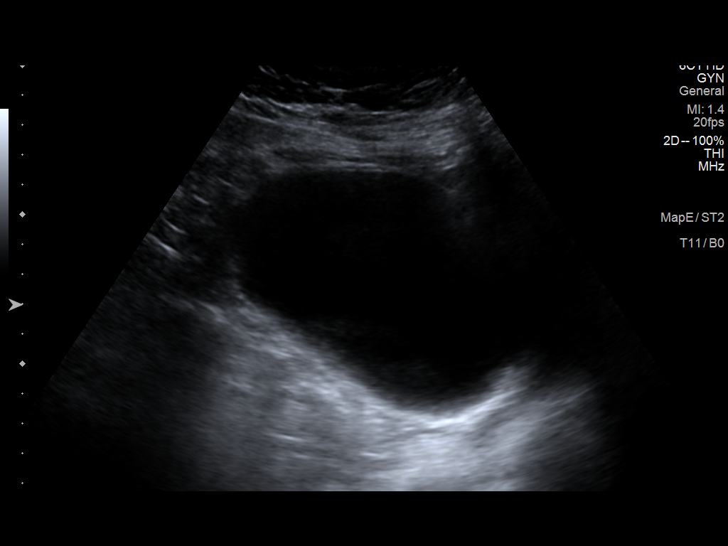
[im 36/44]
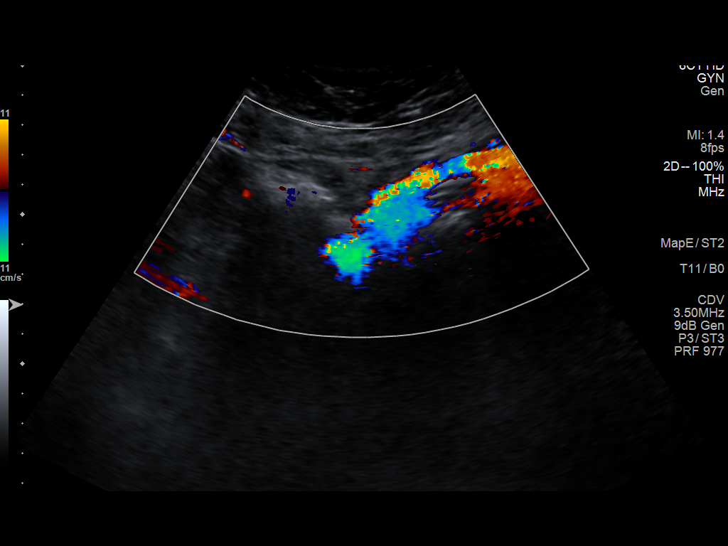
[im 40/44]
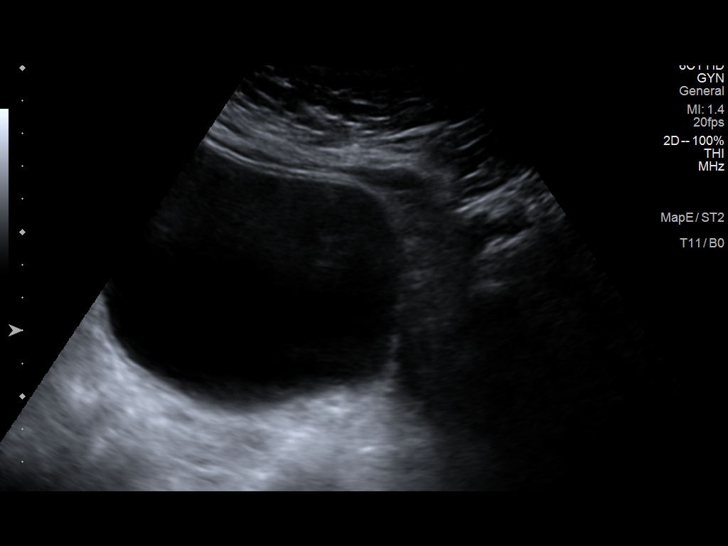
[im 44/44]
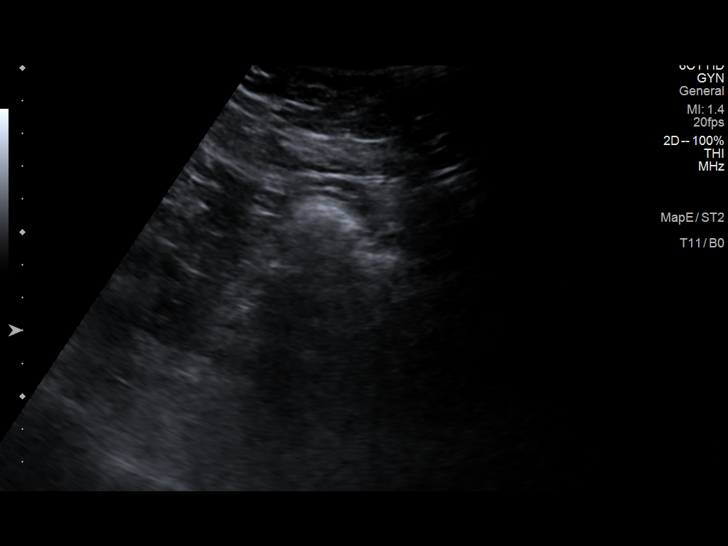

[14 of 25 positions shown; findings below may reference images not displayed]

FINDINGS: Uterus

Surgically absent

Endometrium

N/A

Right ovary

Not visualized question surgically absent versus obscured by bowel

Left ovary

Not visualized question surgically absent versus obscured by bowel

Other findings: No free pelvic fluid. No adnexal masses. Bladder
well distended and unremarkable.
IMPRESSION: Surgical absence of uterus with nonvisualization of ovaries.

No pelvic sonographic abnormalities identified.

## 2021-12-20 ENCOUNTER — Encounter: Payer: Medicare Other | Admitting: Speech Pathology

## 2021-12-20 ENCOUNTER — Ambulatory Visit: Payer: Medicare Other | Admitting: Speech Pathology

## 2021-12-20 ENCOUNTER — Encounter: Payer: Self-pay | Admitting: Dermatology

## 2021-12-22 ENCOUNTER — Ambulatory Visit: Payer: Medicare Other | Admitting: Speech Pathology

## 2021-12-22 LAB — ALPHA-THALASSEMIA GENOTYPR

## 2021-12-27 ENCOUNTER — Encounter: Payer: Medicare Other | Admitting: Speech Pathology

## 2021-12-28 ENCOUNTER — Encounter: Payer: Medicare Other | Admitting: Speech Pathology

## 2022-01-03 ENCOUNTER — Encounter: Payer: Medicare Other | Admitting: Speech Pathology

## 2022-01-05 ENCOUNTER — Encounter: Payer: Medicare Other | Admitting: Speech Pathology

## 2022-01-10 ENCOUNTER — Inpatient Hospital Stay: Payer: Medicare Other | Attending: Oncology

## 2022-01-10 ENCOUNTER — Encounter: Payer: Medicare Other | Admitting: Speech Pathology

## 2022-01-10 ENCOUNTER — Other Ambulatory Visit: Payer: Self-pay

## 2022-01-10 DIAGNOSIS — R768 Other specified abnormal immunological findings in serum: Secondary | ICD-10-CM | POA: Insufficient documentation

## 2022-01-10 DIAGNOSIS — D563 Thalassemia minor: Secondary | ICD-10-CM | POA: Insufficient documentation

## 2022-01-10 DIAGNOSIS — D649 Anemia, unspecified: Secondary | ICD-10-CM | POA: Diagnosis not present

## 2022-01-11 ENCOUNTER — Encounter: Payer: Medicare Other | Admitting: Speech Pathology

## 2022-01-16 ENCOUNTER — Encounter: Payer: Self-pay | Admitting: Oncology

## 2022-01-16 LAB — IFE+PROTEIN ELECTRO, 24-HR UR
% BETA, Urine: 0 %
ALPHA 1 URINE: 0 %
Albumin, U: 100 %
Alpha 2, Urine: 0 %
GAMMA GLOBULIN URINE: 0 %
Total Protein, Urine-Ur/day: 210 mg/24 hr — ABNORMAL HIGH (ref 30–150)
Total Protein, Urine: 6.9 mg/dL
Total Volume: 3050

## 2022-01-17 ENCOUNTER — Encounter: Payer: Medicare Other | Admitting: Speech Pathology

## 2022-01-19 ENCOUNTER — Encounter: Payer: Medicare Other | Admitting: Speech Pathology

## 2022-01-24 ENCOUNTER — Encounter: Payer: Medicare Other | Admitting: Speech Pathology

## 2022-01-25 ENCOUNTER — Encounter: Payer: Medicare Other | Admitting: Speech Pathology

## 2022-01-30 ENCOUNTER — Encounter: Payer: Medicare Other | Admitting: Speech Pathology

## 2022-02-01 ENCOUNTER — Encounter: Payer: Medicare Other | Admitting: Speech Pathology

## 2022-02-06 ENCOUNTER — Encounter: Payer: Medicare Other | Admitting: Speech Pathology

## 2022-02-08 ENCOUNTER — Encounter: Payer: Medicare Other | Admitting: Speech Pathology

## 2022-02-13 ENCOUNTER — Encounter: Payer: Medicare Other | Admitting: Speech Pathology

## 2022-02-14 ENCOUNTER — Encounter: Payer: Medicare Other | Admitting: Dermatology

## 2022-02-15 ENCOUNTER — Encounter: Payer: Medicare Other | Admitting: Speech Pathology

## 2022-02-20 ENCOUNTER — Encounter: Payer: Medicare Other | Admitting: Speech Pathology

## 2022-02-22 ENCOUNTER — Encounter: Payer: Medicare Other | Admitting: Speech Pathology

## 2022-03-07 ENCOUNTER — Telehealth: Payer: Self-pay

## 2022-03-07 ENCOUNTER — Ambulatory Visit (INDEPENDENT_AMBULATORY_CARE_PROVIDER_SITE_OTHER): Payer: Medicare Other | Admitting: Dermatology

## 2022-03-07 DIAGNOSIS — D492 Neoplasm of unspecified behavior of bone, soft tissue, and skin: Secondary | ICD-10-CM

## 2022-03-07 DIAGNOSIS — L720 Epidermal cyst: Secondary | ICD-10-CM | POA: Diagnosis not present

## 2022-03-07 MED ORDER — MUPIROCIN 2 % EX OINT: 1.0000 | TOPICAL_OINTMENT | Freq: Every day | CUTANEOUS | 1 refills | Status: DC

## 2022-03-07 NOTE — Telephone Encounter (Signed)
Left message regarding surgery/hd

## 2022-03-07 NOTE — Patient Instructions (Signed)

## 2022-03-07 NOTE — Progress Notes (Signed)
   Follow-Up Visit   Subjective  Chelsey Price is a 66 y.o. female who presents for the following: Cyst vs other (R chest, pt presents for excision).  The following portions of the chart were reviewed this encounter and updated as appropriate:   Tobacco  Allergies  Meds  Problems  Med Hx  Surg Hx  Fam Hx     Review of Systems:  No other skin or systemic complaints except as noted in HPI or Assessment and Plan.  Objective  Well appearing patient in no apparent distress; mood and affect are within normal limits.  A focused examination was performed including right chest. Relevant physical exam findings are noted in the Assessment and Plan.  R chest Cystic pap 1.3cm   Assessment & Plan  Neoplasm of skin R chest  Skin excision  Lesion length (cm):  1.3 Lesion width (cm):  1.3 Margin per side (cm):  0 Total excision diameter (cm):  1.3 Informed consent: discussed and consent obtained   Timeout: patient name, date of birth, surgical site, and procedure verified   Procedure prep:  Patient was prepped and draped in usual sterile fashion Prep type:  Isopropyl alcohol and povidone-iodine Anesthesia: the lesion was anesthetized in a standard fashion   Anesthetic:  1% lidocaine w/ epinephrine 1-100,000 buffered w/ 8.4% NaHCO3 (6cc lido w/ epi, 3cc bupivicaine, Total of 9cc) Instrument used comment:  #15c blade Hemostasis achieved with: pressure   Hemostasis achieved with comment:  Electrocautery Outcome: patient tolerated procedure well with no complications   Post-procedure details: sterile dressing applied and wound care instructions given   Dressing type: bandage, pressure dressing and bacitracin (Mupirocin)    Skin repair Complexity:  Complex Final length (cm):  2.2 Reason for type of repair: reduce tension to allow closure, reduce the risk of dehiscence, infection, and necrosis, reduce subcutaneous dead space and avoid a hematoma, allow closure of the large  defect, preserve normal anatomy, preserve normal anatomical and functional relationships and enhance both functionality and cosmetic results   Undermining: area extensively undermined   Undermining comment:  Undermining Defect 1.3cm Subcutaneous layers (deep stitches):  Suture size:  4-0 Suture type: Vicryl (polyglactin 910)   Subcutaneous suture technique: Inverted Dermal. Fine/surface layer approximation (top stitches):  Suture size:  4-0 Suture type: nylon   Stitches: simple running   Suture removal (days):  7 Hemostasis achieved with: pressure Outcome: patient tolerated procedure well with no complications   Post-procedure details: sterile dressing applied and wound care instructions given   Dressing type: bandage, pressure dressing and bacitracin (Mupirocin)    Specimen 1 - Surgical pathology Differential Diagnosis: D48.5 Cyst vs other  Check Margins: No Cystic pap 1.3cm  Cyst vs other, excised today Start Mupirocin ointment qd to excision site  Related Medications mupirocin ointment (BACTROBAN) 2 % Apply 1 Application topically daily. Qd to excision site   Return in about 1 week (around 03/14/2022) for suture removal.  I, Othelia Pulling, RMA, am acting as scribe for Sarina Ser, MD . Documentation: I have reviewed the above documentation for accuracy and completeness, and I agree with the above.  Sarina Ser, MD

## 2022-03-14 ENCOUNTER — Ambulatory Visit (INDEPENDENT_AMBULATORY_CARE_PROVIDER_SITE_OTHER): Payer: Medicare Other | Admitting: Dermatology

## 2022-03-14 ENCOUNTER — Ambulatory Visit: Payer: Medicare Other | Admitting: Dermatology

## 2022-03-14 DIAGNOSIS — Z4802 Encounter for removal of sutures: Secondary | ICD-10-CM

## 2022-03-14 DIAGNOSIS — L720 Epidermal cyst: Secondary | ICD-10-CM

## 2022-03-14 NOTE — Patient Instructions (Signed)
Due to recent changes in healthcare laws, you may see results of your pathology and/or laboratory studies on MyChart before the doctors have had a chance to review them. We understand that in some cases there may be results that are confusing or concerning to you. Please understand that not all results are received at the same time and often the doctors may need to interpret multiple results in order to provide you with the best plan of care or course of treatment. Therefore, we ask that you please give us 2 business days to thoroughly review all your results before contacting the office for clarification. Should we see a critical lab result, you will be contacted sooner.   If You Need Anything After Your Visit  If you have any questions or concerns for your doctor, please call our main line at [phone] and press option 4 to reach your doctor's medical assistant. If no one answers, please leave a voicemail as directed and we will return your call as soon as possible. Messages left after 4 pm will be answered the following business day.   You may also send us a message via MyChart. We typically respond to MyChart messages within 1-2 business days.  For prescription refills, please ask your pharmacy to contact our office. Our fax number is [phone].  If you have an urgent issue when the clinic is closed that cannot wait until the next business day, you can page your doctor at the number below.    Please note that while we do our best to be available for urgent issues outside of office hours, we are not available 24/7.   If you have an urgent issue and are unable to reach us, you may choose to seek medical care at your doctor's office, retail clinic, urgent care center, or emergency room.  If you have a medical emergency, please immediately call 911 or go to the emergency department.  Pager Numbers  - Dr. Kowalski: [phone]  - Dr. Moye: [phone]  - Dr. Stewart:  [phone]  In the event of inclement weather, please call our main line at [phone] for an update on the status of any delays or closures.  Dermatology Medication Tips: Please keep the boxes that topical medications come in in order to help keep track of the instructions about where and how to use these. Pharmacies typically print the medication instructions only on the boxes and not directly on the medication tubes.   If your medication is too expensive, please contact our office at [phone] option 4 or send us a message through MyChart.   We are unable to tell what your co-pay for medications will be in advance as this is different depending on your insurance coverage. However, we may be able to find a substitute medication at lower cost or fill out paperwork to get insurance to cover a needed medication.   If a prior authorization is required to get your medication covered by your insurance company, please allow us 1-2 business days to complete this process.  Drug prices often vary depending on where the prescription is filled and some pharmacies may offer cheaper prices.  The website www.goodrx.com contains coupons for medications through different pharmacies. The prices here do not account for what the cost may be with help from insurance (it may be cheaper with your insurance), but the website can give you the price if you did not use any insurance.  - You can print the associated coupon and take it with   your prescription to the pharmacy.  - You may also stop by our office during regular business hours and pick up a GoodRx coupon card.  - If you need your prescription sent electronically to a different pharmacy, notify our office through [facility] MyChart or by phone at [phone] option 4.     Si Usted Necesita Algo Despus de Su Visita  Tambin puede enviarnos un mensaje a travs de MyChart. Por lo general respondemos a los mensajes de MyChart en el transcurso de 1 a 2  das hbiles.  Para renovar recetas, por favor pida a su farmacia que se ponga en contacto con nuestra oficina. Nuestro nmero de fax es el [phone].  Si tiene un asunto urgente cuando la clnica est cerrada y que no puede esperar hasta el siguiente da hbil, puede llamar/localizar a su doctor(a) al nmero que aparece a continuacin.   Por favor, tenga en cuenta que aunque hacemos todo lo posible para estar disponibles para asuntos urgentes fuera del horario de oficina, no estamos disponibles las 24 horas del da, los 7 das de la semana.   Si tiene un problema urgente y no puede comunicarse con nosotros, puede optar por buscar atencin mdica  en el consultorio de su doctor(a), en una clnica privada, en un centro de atencin urgente o en una sala de emergencias.  Si tiene una emergencia mdica, por favor llame inmediatamente al 911 o vaya a la sala de emergencias.  Nmeros de bper  - Dr. Kowalski: [phone]  - Dra. Moye: [phone]  - Dra. Stewart: [phone]  En caso de inclemencias del tiempo, por favor llame a nuestra lnea principal al [phone] para una actualizacin sobre el estado de cualquier retraso o cierre.  Consejos para la medicacin en dermatologa: Por favor, guarde las cajas en las que vienen los medicamentos de uso tpico para ayudarle a seguir las instrucciones sobre dnde y cmo usarlos. Las farmacias generalmente imprimen las instrucciones del medicamento slo en las cajas y no directamente en los tubos del medicamento.   Si su medicamento es muy caro, por favor, pngase en contacto con nuestra oficina llamando al [phone] y presione la opcin 4 o envenos un mensaje a travs de MyChart.   No podemos decirle cul ser su copago por los medicamentos por adelantado ya que esto es diferente dependiendo de la cobertura de su seguro. Sin embargo, es posible que podamos encontrar un medicamento sustituto a menor costo o llenar un formulario para que el  seguro cubra el medicamento que se considera necesario.   Si se requiere una autorizacin previa para que su compaa de seguros cubra su medicamento, por favor permtanos de 1 a 2 das hbiles para completar este proceso.  Los precios de los medicamentos varan con frecuencia dependiendo del lugar de dnde se surte la receta y alguna farmacias pueden ofrecer precios ms baratos.  El sitio web www.goodrx.com tiene cupones para medicamentos de diferentes farmacias. Los precios aqu no tienen en cuenta lo que podra costar con la ayuda del seguro (puede ser ms barato con su seguro), pero el sitio web puede darle el precio si no utiliz ningn seguro.  - Puede imprimir el cupn correspondiente y llevarlo con su receta a la farmacia.  - Tambin puede pasar por nuestra oficina durante el horario de atencin regular y recoger una tarjeta de cupones de GoodRx.  - Si necesita que su receta se enve electrnicamente a una farmacia diferente, informe a nuestra oficina a travs de MyChart de [place]   o por telfono llamando al [phone] y presione la opcin 4.  

## 2022-03-14 NOTE — Progress Notes (Signed)
   Follow-Up Visit   Subjective  Chelsey Price is a 66 y.o. female who presents for the following: Post op/suture removal  (R chest S/P excision of pathology proven benign cyst - patient is here today for suture removal).  The following portions of the chart were reviewed this encounter and updated as appropriate:   Tobacco  Allergies  Meds  Problems  Med Hx  Surg Hx  Fam Hx     Review of Systems:  No other skin or systemic complaints except as noted in HPI or Assessment and Plan.  Objective  Well appearing patient in no apparent distress; mood and affect are within normal limits.  A focused examination was performed including the chest. Relevant physical exam findings are noted in the Assessment and Plan.  R chest Healing excision site.   Assessment & Plan  Epidermal inclusion cyst R chest  Encounter for Removal of Sutures - Incision site at the R chest is clean, dry and intact - Wound cleansed, sutures removed, wound cleansed and steri strips applied.  - Discussed pathology results showing a benign cyst.  - Patient advised to keep steri-strips dry until they fall off. - Scars remodel for a full year. - Once steri-strips fall off, patient can apply over-the-counter silicone scar cream each night to help with scar remodeling if desired. - Patient advised to call with any concerns or if they notice any new or changing lesions.  Return if symptoms worsen or fail to improve.  Luther Redo, CMA, am acting as scribe for Sarina Ser, MD . Documentation: I have reviewed the above documentation for accuracy and completeness, and I agree with the above.  Sarina Ser, MD

## 2022-03-15 ENCOUNTER — Encounter: Payer: Self-pay | Admitting: Dermatology

## 2022-03-24 ENCOUNTER — Encounter: Payer: Self-pay | Admitting: Dermatology

## 2023-05-19 IMAGING — CT CT CHEST W/O CM
2 of 4 series · 15 of 36 positions shown, 18 images · non-contrast
Comparison: None.

CLINICAL DATA: Right upper lobe pneumonia



[Series 2: thorax · axial · 0.55mm/px · z∈[-7,+213]mm · 12 of 130 slices shown, 15 images]
[im 10/130  mediastinal]
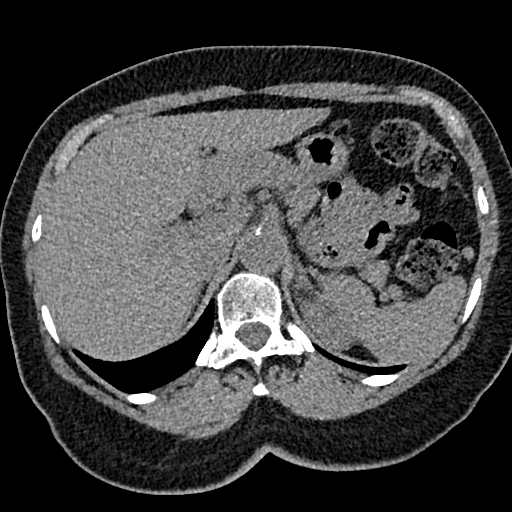
[im 10/130  lung]
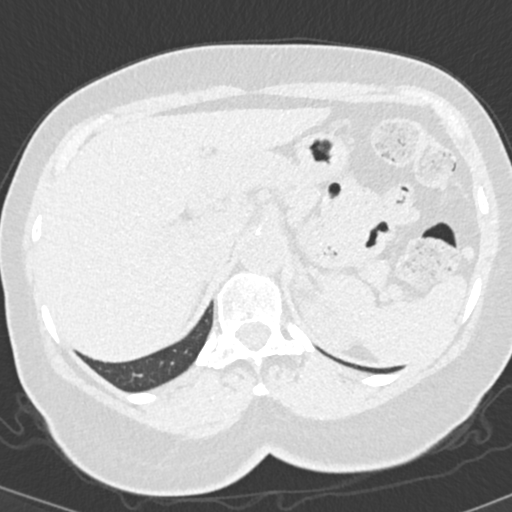
[im 20/130  lung]
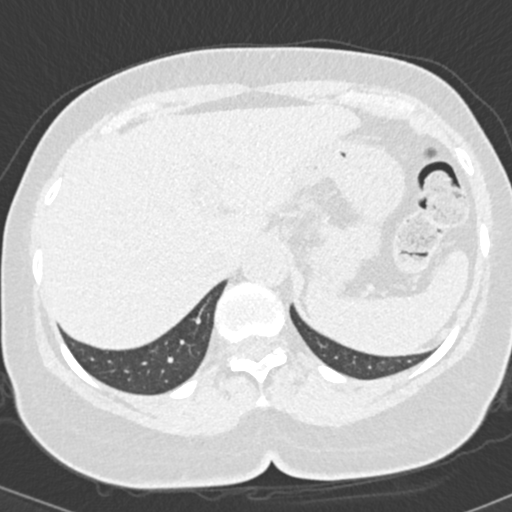
[im 30/130  lung]
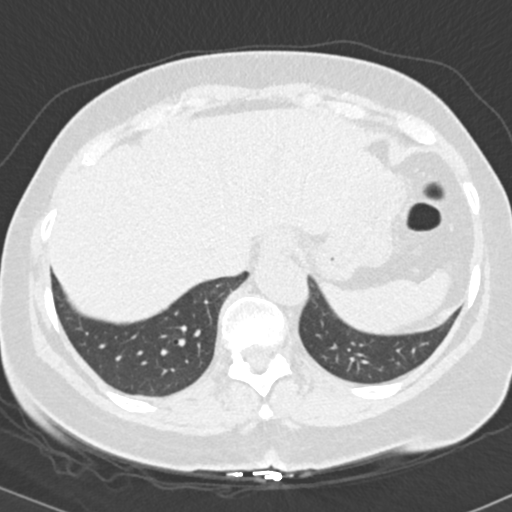
[im 40/130  lung]
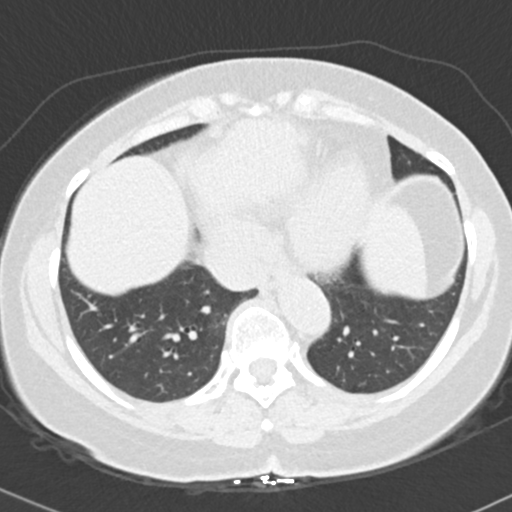
[im 50/130  mediastinal]
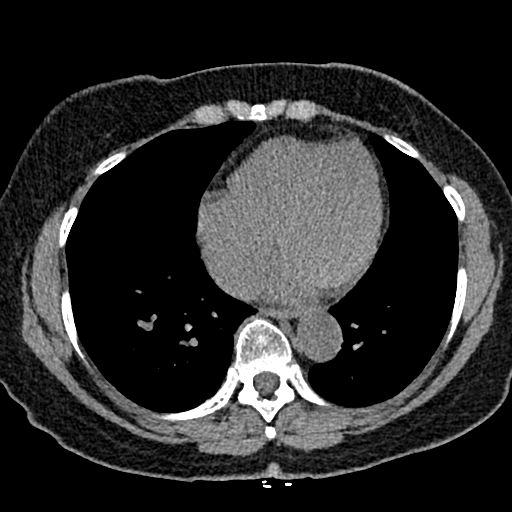
[im 50/130  lung]
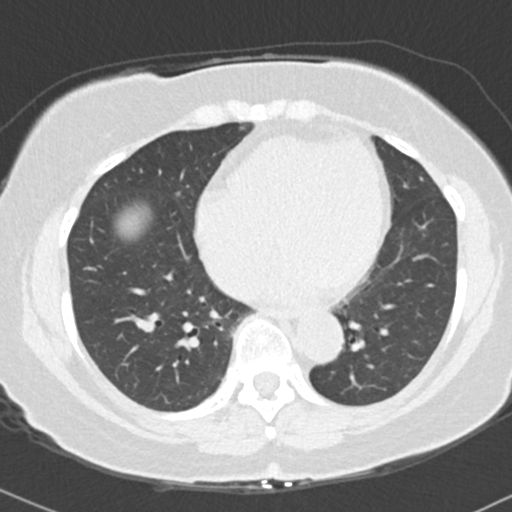
[im 60/130  lung]
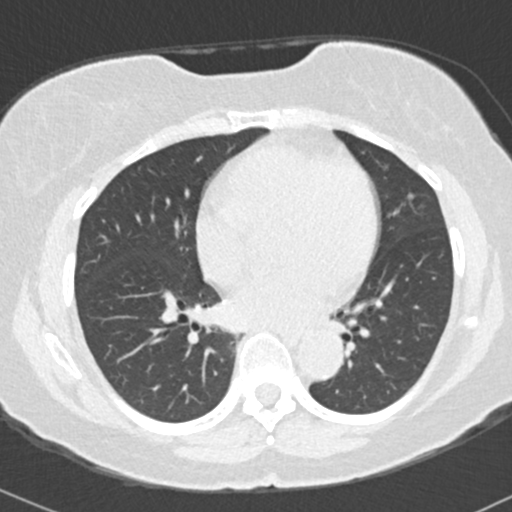
[im 70/130  lung]
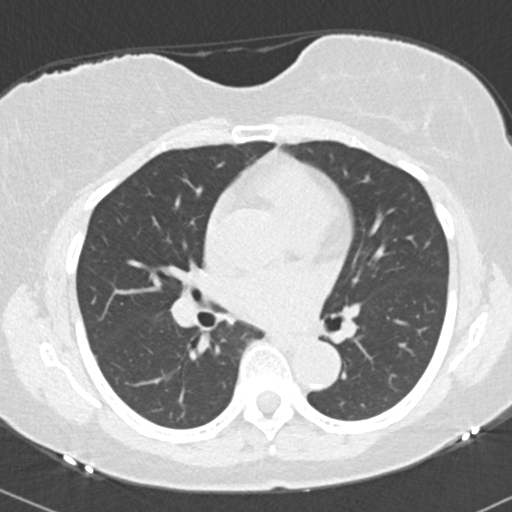
[im 80/130  lung]
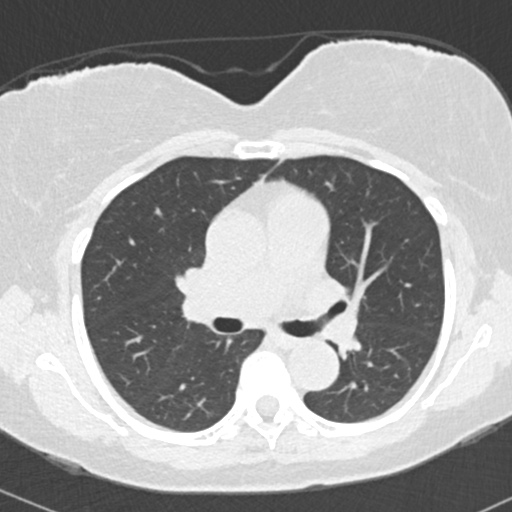
[im 90/130  mediastinal]
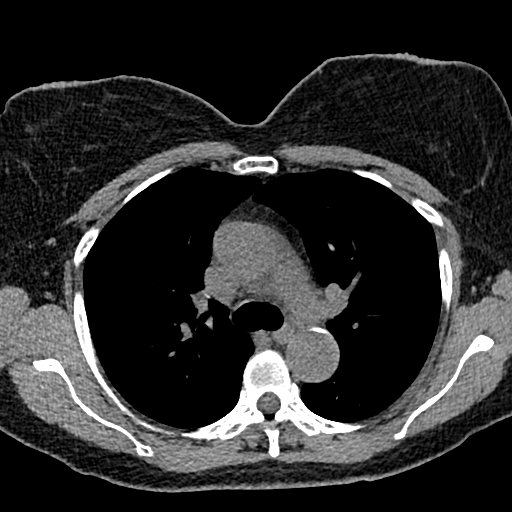
[im 90/130  lung]
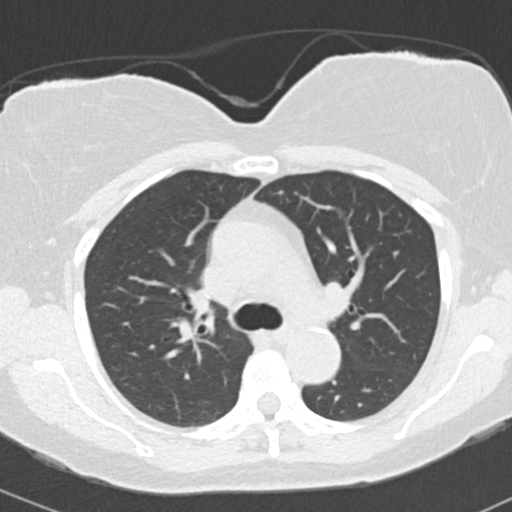
[im 100/130  lung]
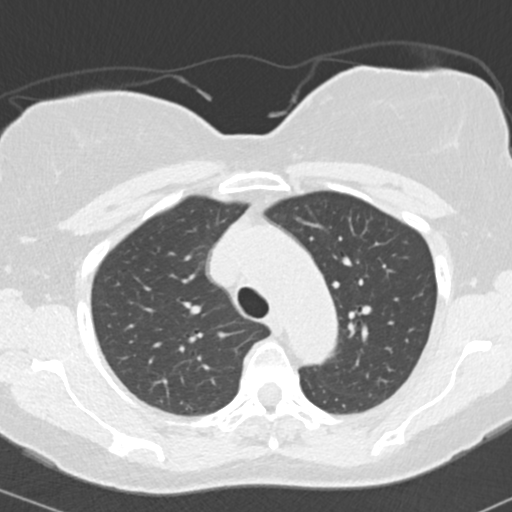
[im 110/130  lung]
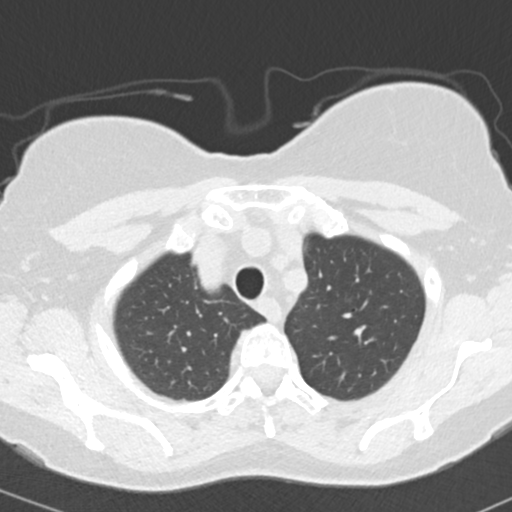
[im 120/130  lung]
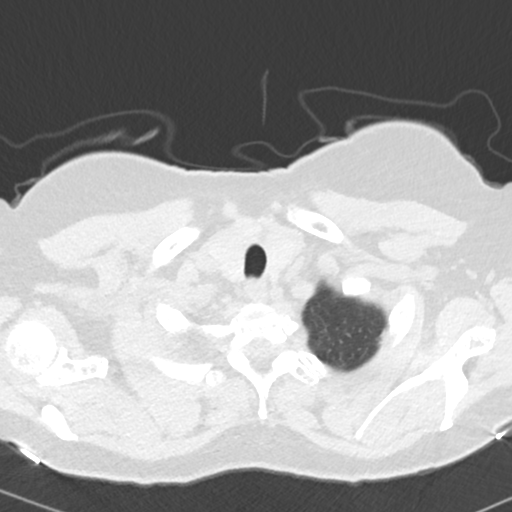

[Series 5: coronal · coronal · 0.52mm/px · 3 of 126 slices shown]
[im 26/126  lung]
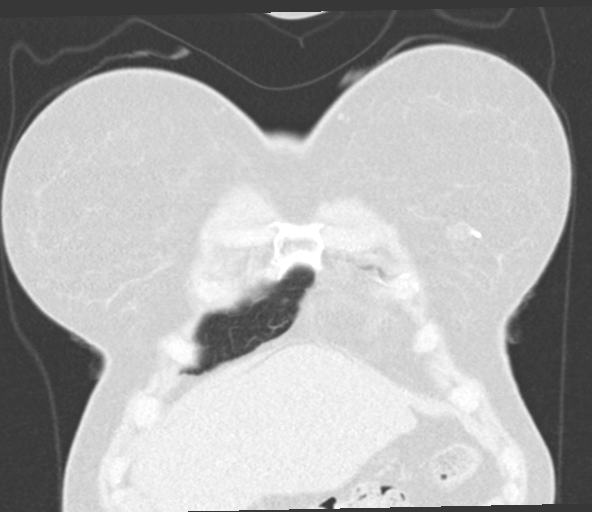
[im 51/126  lung]
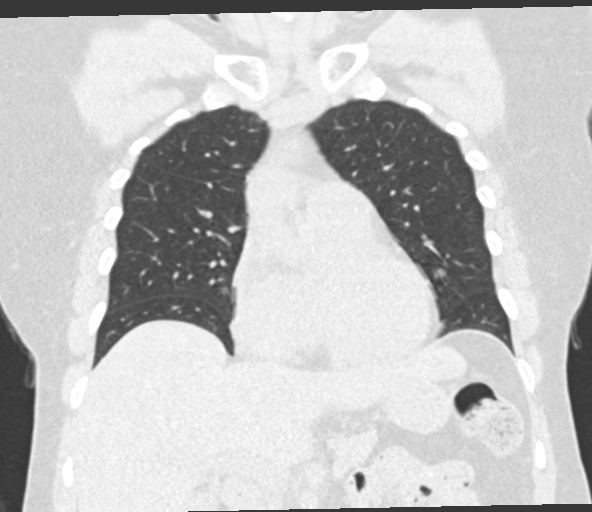
[im 76/126  lung]
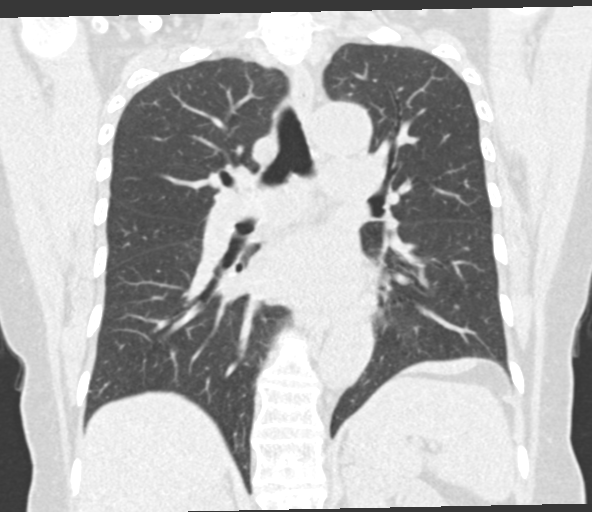

[15 of 36 positions shown; findings below may reference images not displayed]

FINDINGS: Cardiovascular: Aortic atherosclerosis. Normal heart size. No
pericardial effusion.

Mediastinum/Nodes: No enlarged mediastinal, hilar, or axillary lymph
nodes. Thyroid gland, trachea, and esophagus demonstrate no
significant findings.

Lungs/Pleura: Lungs are clear. No pleural effusion or pneumothorax.

Upper Abdomen: No acute abnormality. Punctuate nonobstructive
calculus of the superior pole of the left kidney. Low-attenuation
lesions of the right lobe of the liver, incompletely characterized
by noncontrast CT although likely cysts or hemangiomata.

Musculoskeletal: No chest wall abnormality. No suspicious osseous
lesions identified.
IMPRESSION: 1. No evidence of airspace disease.
2. Punctuate nonobstructive left nephrolithiasis.

Aortic Atherosclerosis ([id number]).

## 2023-08-31 ENCOUNTER — Encounter: Payer: Self-pay | Admitting: Internal Medicine

## 2023-09-18 ENCOUNTER — Encounter: Payer: Self-pay | Admitting: Internal Medicine

## 2023-09-19 ENCOUNTER — Encounter
Admission: RE | Disposition: A | Discharge status: Home / Self Care | Payer: Self-pay | Source: Home / Self Care | Attending: Internal Medicine

## 2023-09-19 ENCOUNTER — Other Ambulatory Visit: Payer: Self-pay

## 2023-09-19 ENCOUNTER — Ambulatory Visit: Payer: Medicare PPO | Admitting: Registered Nurse

## 2023-09-19 ENCOUNTER — Ambulatory Visit
Admission: RE | Admit: 2023-09-19 | Discharge: 2023-09-19 | Disposition: A | Discharge status: Home / Self Care | Payer: Medicare PPO | Attending: Internal Medicine | Admitting: Internal Medicine

## 2023-09-19 ENCOUNTER — Encounter: Payer: Self-pay | Admitting: Internal Medicine

## 2023-09-19 DIAGNOSIS — K2289 Other specified disease of esophagus: Secondary | ICD-10-CM | POA: Insufficient documentation

## 2023-09-19 DIAGNOSIS — K219 Gastro-esophageal reflux disease without esophagitis: Secondary | ICD-10-CM | POA: Insufficient documentation

## 2023-09-19 DIAGNOSIS — K5730 Diverticulosis of large intestine without perforation or abscess without bleeding: Secondary | ICD-10-CM | POA: Diagnosis not present

## 2023-09-19 DIAGNOSIS — D120 Benign neoplasm of cecum: Secondary | ICD-10-CM | POA: Diagnosis not present

## 2023-09-19 DIAGNOSIS — K449 Diaphragmatic hernia without obstruction or gangrene: Secondary | ICD-10-CM | POA: Insufficient documentation

## 2023-09-19 DIAGNOSIS — Z7951 Long term (current) use of inhaled steroids: Secondary | ICD-10-CM | POA: Insufficient documentation

## 2023-09-19 DIAGNOSIS — K589 Irritable bowel syndrome without diarrhea: Secondary | ICD-10-CM | POA: Diagnosis not present

## 2023-09-19 DIAGNOSIS — Z1211 Encounter for screening for malignant neoplasm of colon: Secondary | ICD-10-CM | POA: Diagnosis present

## 2023-09-19 HISTORY — PX: BIOPSY: SHX5522

## 2023-09-19 HISTORY — PX: ESOPHAGOGASTRODUODENOSCOPY (EGD) WITH PROPOFOL: SHX5813

## 2023-09-19 HISTORY — PX: POLYPECTOMY: SHX5525

## 2023-09-19 HISTORY — PX: COLONOSCOPY WITH PROPOFOL: SHX5780

## 2023-09-19 SURGERY — COLONOSCOPY WITH PROPOFOL
Anesthesia: General

## 2023-09-19 MED ORDER — PROPOFOL 1000 MG/100ML IV EMUL
INTRAVENOUS | Status: AC
  Filled 2023-09-19: qty 100

## 2023-09-19 MED ORDER — SODIUM CHLORIDE 0.9 % IV SOLN: INTRAVENOUS | Status: DC

## 2023-09-19 MED ORDER — DEXMEDETOMIDINE HCL IN NACL 80 MCG/20ML IV SOLN
INTRAVENOUS | Status: DC | PRN
  Administered 2023-09-19: 2 ug via INTRAVENOUS
  Administered 2023-09-19: 10 ug via INTRAVENOUS

## 2023-09-19 MED ORDER — EPHEDRINE SULFATE-NACL 50-0.9 MG/10ML-% IV SOSY
PREFILLED_SYRINGE | INTRAVENOUS | Status: DC | PRN
  Administered 2023-09-19: 5 mg via INTRAVENOUS
  Administered 2023-09-19: 10 mg via INTRAVENOUS

## 2023-09-19 MED ORDER — EPHEDRINE 5 MG/ML INJ
INTRAVENOUS | Status: AC
  Filled 2023-09-19: qty 5

## 2023-09-19 MED ORDER — LIDOCAINE HCL (CARDIAC) PF 100 MG/5ML IV SOSY
PREFILLED_SYRINGE | INTRAVENOUS | Status: DC | PRN
  Administered 2023-09-19: 100 mg via INTRAVENOUS

## 2023-09-19 MED ORDER — PROPOFOL 10 MG/ML IV BOLUS
INTRAVENOUS | Status: DC | PRN
  Administered 2023-09-19: 40 mg via INTRAVENOUS
  Administered 2023-09-19: 100 mg via INTRAVENOUS
  Administered 2023-09-19 (×2): 50 mg via INTRAVENOUS
  Administered 2023-09-19: 75 ug/kg/min via INTRAVENOUS

## 2023-09-19 MED ORDER — LIDOCAINE HCL (PF) 2 % IJ SOLN
INTRAMUSCULAR | Status: AC
  Filled 2023-09-19: qty 5

## 2023-09-19 NOTE — Interval H&P Note (Signed)
History and Physical Interval Note:  09/19/2023 12:43 PM  Chelsey Price  has presented today for surgery, with the diagnosis of R11.0 (ICD-10-CM) - Nausea Z12.11 (ICD-10-CM) - Colon cancer screening K21.9 (ICD-10-CM) - Gastroesophageal reflux disease, unspecified whether esophagitis present.  The various methods of treatment have been discussed with the patient and family. After consideration of risks, benefits and other options for treatment, the patient has consented to  Procedure(s): COLONOSCOPY WITH PROPOFOL (N/A) ESOPHAGOGASTRODUODENOSCOPY (EGD) WITH PROPOFOL (N/A) as a surgical intervention.  The patient's history has been reviewed, patient examined, no change in status, stable for surgery.  I have reviewed the patient's chart and labs.  Questions were answered to the patient's satisfaction.     [place], [name]

## 2023-09-19 NOTE — H&P (Signed)
Outpatient short stay form Pre-procedure 09/19/2023 12:41 PM Chelsey Price K. Norma Fredrickson, M.D.  Primary Physician: Leim Fabry, M.D.  Reason for visit:  Nausea  Colon cancer screening  Gastroesophageal reflux disease, unspecified whether esophagitis present   History of present illness:  Chelsey Price reports having chronic issues with alternating constipation and diarrhea. She reports that she can have constipation and no bowel movement for several days and then other times have daily stools or have diarrhea. She is currently using MiraLAX twice a week, sometimes diarrhea will occur after MiraLAX and other times will occur at random. When she gets constipated often tries to eat solids or ice cream to help induce a bowel movement. She has some abdominal pain with both the diarrhea and the constipation. Sometimes has leakage of stool when she is having the diarrhea and wears a pad. She uses Bentyl occasionally if having abdominal discomfort which seems to help.  She reports not having any GERD symptoms on pantoprazole 40 mg once a day and Pepcid 20 mg twice a day. She feels these regimens overall work well. She does endorse nausea, occurs mainly when having the IBS diarrhea, tries to drink warm ginger ale to help the nausea. Unsure if nausea gets better or worse with eating as she generally has no appetite with the nausea. She is not vomiting. No epigastric pain or dysphagia.  She denies tobacco alcohol and NSAIDs.  I have reviewed the following data today:  02/2023 labs reviewed -CMP normal.  2019 - EGD/colonoscopy - regular z line, gastritis, non bleeding angiectasia in stomach, mucosal variant of duodenum. Redundant colon, diverticulosis sigmoid, descending, transverse. Pathology with gastritis, negative H. pylori. Random colon biopsy negative for microscopic colitis. Colonoscopy completed to hepatic flexure. 5 year repeat colonoscopy. ACBE/CT Colonography not completed after last colonoscopy as  recommended.    Current Facility-Administered Medications:    0.9 %  sodium chloride infusion, , Intravenous, Continuous, Freyja Govea, Boykin Nearing, MD, Last Rate: 20 mL/hr at 09/19/23 1140, New Bag at 09/19/23 1140  Medications Prior to Admission  Medication Sig Dispense Refill Last Dose/Taking   albuterol (PROVENTIL HFA;VENTOLIN HFA) 108 (90 Base) MCG/ACT inhaler Inhale 2 puffs into the lungs every 4 (four) hours as needed for wheezing or shortness of breath.   09/19/2023   betamethasone dipropionate 0.05 % lotion Apply topically as needed.   09/18/2023   cyanocobalamin (,VITAMIN B-12,) 1000 MCG/ML injection Inject 1,000 mcg into the muscle every 30 (thirty) days.   09/18/2023   dicyclomine (BENTYL) 10 MG capsule Take 10 mg by mouth 4 (four) times daily -  before meals and at bedtime.   09/18/2023   famotidine (PEPCID) 20 MG tablet Take 20 mg by mouth 2 (two) times daily.   09/18/2023   fluticasone (FLONASE) 50 MCG/ACT nasal spray Place 2 sprays into both nostrils daily.   09/18/2023   Fluticasone-Salmeterol (ADVAIR) 100-50 MCG/DOSE AEPB Inhale 1 puff into the lungs 2 (two) times daily.   09/18/2023   ibuprofen (ADVIL,MOTRIN) 600 MG tablet Take 600 mg by mouth every 6 (six) hours.   09/18/2023   montelukast (SINGULAIR) 10 MG tablet Take 10 mg by mouth at bedtime.   09/18/2023   mupirocin ointment (BACTROBAN) 2 % Apply 1 Application topically daily. Qd to excision site 22 g 1 09/18/2023   Nebulizers (INNOSPIRE ELEGANCE NEBULIZER) MISC See admin instructions.   09/18/2023   polyethylene glycol (MIRALAX / GLYCOLAX) packet Take by mouth. Take 17 g by mouth once daily as needed. Mix in 4-8ounces of fluid  prior to taking. Pt reports she for IBS flare   09/18/2023   rosuvastatin (CRESTOR) 5 MG tablet Take by mouth.   09/18/2023   levothyroxine (SYNTHROID) 25 MCG tablet Take by mouth.      pantoprazole (PROTONIX) 40 MG tablet Take 40 mg by mouth daily.  (Patient not taking: Reported on 12/01/2021)        Allergies   Allergen Reactions   Iodine    Shellfish Allergy    Sulfa Antibiotics    Tomato      Past Medical History:  Diagnosis Date   Abdominal adhesions    Asthma without status asthmaticus    Cataract of both eyes    High myopia    IBS (irritable bowel syndrome)    Low vision, both eyes    PONV (postoperative nausea and vomiting)    Retinal detachment     Review of systems:  Otherwise negative.    Physical Exam  Gen: Alert, oriented. Appears stated age.  HEENT: [place]/AT. PERRLA. Lungs: CTA, no wheezes. CV: RR nl S1, S2. Abd: soft, benign, no masses. BS+ Ext: No edema. Pulses 2+    Planned procedures: Proceed with EGD and colonoscopy. The patient understands the nature of the planned procedure, indications, risks, alternatives and potential complications including but not limited to bleeding, infection, perforation, damage to internal organs and possible oversedation/side effects from anesthesia. The patient agrees and gives consent to proceed.  Please refer to procedure notes for findings, recommendations and patient disposition/instructions.     Joe Gee K. Norma Fredrickson, M.D. Gastroenterology 09/19/2023  12:41 PM

## 2023-09-19 NOTE — Op Note (Signed)
[facility] Patient Name: Chelsey Price Procedure Date: 09/19/2023 12:32 PM MRN: [id number] Account #: [id number] Date of Birth: Jan 09, 1956 Admit Type: Outpatient Age: 68 Room: [facility] ENDO ROOM 2 Gender: Female Note Status: Finalized Instrument Name: Prentice Docker [id number] Procedure:             Colonoscopy Indications:           Screening for colorectal malignant neoplasm Providers:             Royce Macadamia K. Norma Fredrickson MD, MD Referring MD:          Leim Fabry MD, MD (Referring MD) Medicines:             Propofol per Anesthesia Complications:         No immediate complications. Estimated blood loss:                         Minimal. Procedure:             Pre-Anesthesia Assessment:                        - The risks and benefits of the procedure and the                         sedation options and risks were discussed with the                         patient. All questions were answered and informed                         consent was obtained.                        - Patient identification and proposed procedure were                         verified prior to the procedure by the nurse. The                         procedure was verified in the procedure room.                        - ASA Grade Assessment: III - A patient with severe                         systemic disease.                        - After reviewing the risks and benefits, the patient                         was deemed in satisfactory condition to undergo the                         procedure.                        After obtaining informed consent, the colonoscope was                         passed under direct  vision. Throughout the procedure,                         the patient's blood pressure, pulse, and oxygen                         saturations were monitored continuously. The                         Colonoscope was introduced through the anus and                          advanced to the the cecum, identified by appendiceal                         orifice and ileocecal valve. The colonoscopy was                         technically difficult and complex due to significant                         looping and a tortuous colon. Successful completion of                         the procedure was aided by applying abdominal                         pressure. The patient tolerated the procedure well.                         The quality of the bowel preparation was good. The                         ileocecal valve, appendiceal orifice, and rectum were                         photographed. Findings:      The perianal and digital rectal examinations were normal. Pertinent       negatives include normal sphincter tone and no palpable rectal lesions.      A few medium-mouthed and small-mouthed diverticula were found in the       sigmoid colon and ascending colon.      A 4 mm polyp was found in the cecum. The polyp was sessile. The polyp       was removed with a jumbo cold forceps. Resection and retrieval were       complete.      The exam was otherwise without abnormality on direct and retroflexion       views. Impression:            - Diverticulosis in the sigmoid colon and in the                         ascending colon.                        - One 4 mm polyp in the cecum, removed with a jumbo  cold forceps. Resected and retrieved.                        - The examination was otherwise normal on direct and                         retroflexion views. Recommendation:        - Await pathology results from EGD, also performed                         today.                        - Patient has a contact number available for                         emergencies. The signs and symptoms of potential                         delayed complications were discussed with the patient.                         Return to normal activities tomorrow. Written                          discharge instructions were provided to the patient.                        - Resume previous diet.                        - Continue present medications.                        - Await pathology results.                        - Repeat colonoscopy is recommended for surveillance.                         The colonoscopy date will be determined after                         pathology results from today's exam become available                         for review.                        - Follow up with Tawni Pummel, PA-C at [facility]. [phone].                        - Telephone GI office to schedule appointment in 3                         months.                        - Do  an upper GI series at appointment to be scheduled.                        - Consider evaluation of hernia and if repair needed                         based upone UGI series result.                        - The findings and recommendations were discussed with                         the patient. Procedure Code(s):     --- Professional ---                        [phone], Colonoscopy, flexible; with biopsy, single or                         multiple Diagnosis Code(s):     --- Professional ---                        K57.30, Diverticulosis of large intestine without                         perforation or abscess without bleeding                        D12.0, Benign neoplasm of cecum                        Z12.11, Encounter for screening for malignant neoplasm                         of colon CPT copyright 2022 American Medical Association. All rights reserved. The codes documented in this report are preliminary and upon coder review may  be revised to meet current compliance requirements. Stanton Kidney MD, MD 09/19/2023 1:36:00 PM This report has been signed electronically. Number of Addenda: 0 Note Initiated On: 09/19/2023 12:32 PM Scope  Withdrawal Time: 0 hours 6 minutes 8 seconds  Total Procedure Duration: 0 hours 16 minutes 9 seconds  Estimated Blood Loss:  Estimated blood loss was minimal.      [facility]

## 2023-09-19 NOTE — Anesthesia Postprocedure Evaluation (Signed)
Anesthesia Post Note  Patient: Chelsey Price  Procedure(s) Performed: COLONOSCOPY WITH PROPOFOL ESOPHAGOGASTRODUODENOSCOPY (EGD) WITH PROPOFOL BIOPSY POLYPECTOMY  Patient location during evaluation: Endoscopy Anesthesia Type: General Level of consciousness: awake and alert Pain management: pain level controlled Vital Signs Assessment: post-procedure vital signs reviewed and stable Respiratory status: spontaneous breathing, nonlabored ventilation, respiratory function stable and patient connected to nasal cannula oxygen Cardiovascular status: blood pressure returned to baseline and stable Postop Assessment: no apparent nausea or vomiting Anesthetic complications: no   No notable events documented.   Last Vitals:  Vitals:   09/19/23 1336 09/19/23 1344  BP: 101/66 94/62  Pulse: 67 77  Resp: 12 20  Temp:    SpO2: 97% 99%    Last Pain:  Vitals:   09/19/23 1344  TempSrc:   PainSc: 0-No pain                 Corinda Gubler

## 2023-09-19 NOTE — Op Note (Signed)
[facility] Patient Name: Chelsey Price Procedure Date: 09/19/2023 12:34 PM MRN: [id number] Account #: [id number] Date of Birth: 02/19/1956 Admit Type: Outpatient Age: 68 Room: [facility] ENDO ROOM 2 Gender: Female Note Status: Finalized Instrument Name: Upper Endoscope [id number] Procedure:             Upper GI endoscopy Indications:           Gastro-esophageal reflux disease, Nausea Providers:             Boykin Nearing. Norma Fredrickson MD, MD Referring MD:          Leim Fabry MD, MD (Referring MD) Medicines:             Propofol per Anesthesia Complications:         No immediate complications. Procedure:             Pre-Anesthesia Assessment:                        - The risks and benefits of the procedure and the                         sedation options and risks were discussed with the                         patient. All questions were answered and informed                         consent was obtained.                        - The risks and benefits of the procedure and the                         sedation options and risks were discussed with the                         patient. All questions were answered and informed                         consent was obtained.                        - Patient identification and proposed procedure were                         verified prior to the procedure by the nurse. The                         procedure was verified in the procedure room.                        - ASA Grade Assessment: III - A patient with severe                         systemic disease.                        - After reviewing the risks and benefits, the patient  was deemed in satisfactory condition to undergo the                         procedure.                        After obtaining informed consent, the endoscope was                         passed under direct vision. Throughout the procedure,                          the patient's blood pressure, pulse, and oxygen                         saturations were monitored continuously. The Endoscope                         was introduced through the mouth, and advanced to the                         third part of duodenum. The upper GI endoscopy was                         accomplished without difficulty. The patient tolerated                         the procedure well. Findings:      Two tongues of salmon-colored mucosa were present from 35 to 36 cm. No       other visible abnormalities were present. The maximum longitudinal       extent of these esophageal mucosal changes was 1 cm in length. Mucosa       was biopsied with a cold forceps for histology. One specimen bottle was       sent to pathology.      The exam of the esophagus was otherwise normal.      A large hiatal hernia was present.      No other significant abnormalities were identified in a careful       examination of the stomach.      The examined duodenum was normal. Impression:            - Salmon-colored mucosa suspicious for short-segment                         Barrett's esophagus. Biopsied.                        - Large hiatal hernia.                        - Normal examined duodenum. Recommendation:        - Await pathology results.                        - Do an upper GI series at appointment to be                         scheduled. Assess hernia.                        -  Continue present medications.                        - Proceed with colonoscopy Procedure Code(s):     --- Professional ---                        [phone], Esophagogastroduodenoscopy, flexible,                         transoral; with biopsy, single or multiple Diagnosis Code(s):     --- Professional ---                        R11.0, Nausea                        K21.9, Gastro-esophageal reflux disease without                         esophagitis                        K44.9, Diaphragmatic hernia without obstruction  or                         gangrene                        K22.89, Other specified disease of esophagus CPT copyright 2022 American Medical Association. All rights reserved. The codes documented in this report are preliminary and upon coder review may  be revised to meet current compliance requirements. Stanton Kidney MD, MD 09/19/2023 1:11:21 PM This report has been signed electronically. Number of Addenda: 0 Note Initiated On: 09/19/2023 12:34 PM Estimated Blood Loss:  Estimated blood loss was minimal.      [facility]

## 2023-09-19 NOTE — Transfer of Care (Signed)
Immediate Anesthesia Transfer of Care Note  Patient: Chelsey Price  Procedure(s) Performed: COLONOSCOPY WITH PROPOFOL ESOPHAGOGASTRODUODENOSCOPY (EGD) WITH PROPOFOL BIOPSY POLYPECTOMY  Patient Location: Endoscopy Unit  Anesthesia Type:General  Level of Consciousness: drowsy and patient cooperative  Airway & Oxygen Therapy: Patient Spontanous Breathing  Post-op Assessment: Report given to RN and Patient moving all extremities  Post vital signs: Reviewed and stable  Last Vitals:  Vitals Value Taken Time  BP 101/66 09/19/23 1336  Temp 35.6 C 09/19/23 1334  Pulse 67 09/19/23 1336  Resp 12 09/19/23 1336  SpO2 97 % 09/19/23 1336    Last Pain:  Vitals:   09/19/23 1334  TempSrc: Temporal  PainSc: Asleep         Complications: No notable events documented.

## 2023-09-19 NOTE — Anesthesia Preprocedure Evaluation (Signed)
Anesthesia Evaluation  Patient identified by MRN, date of birth, ID band Patient awake    Reviewed: Allergy & Precautions, NPO status , Patient's Chart, lab work & pertinent test results  History of Anesthesia Complications (+) PONV and history of anesthetic complications  Airway Mallampati: II  TM Distance: >3 FB Neck ROM: Full    Dental  (+) Partial Lower, Partial Upper   Pulmonary neg pulmonary ROS, asthma , neg sleep apnea, neg COPD, Patient abstained from smoking.Not current smoker Well controlled asthma, rare rescue inhaler use   Pulmonary exam normal breath sounds clear to auscultation       Cardiovascular Exercise Tolerance: Good METS(-) hypertension(-) CAD and (-) Past MI negative cardio ROS (-) dysrhythmias  Rhythm:Regular Rate:Normal - Systolic murmurs    Neuro/Psych negative neurological ROS  negative psych ROS   GI/Hepatic ,neg GERD  ,,(+)     (-) substance abuse    Endo/Other  neg diabetes    Renal/GU negative Renal ROS     Musculoskeletal   Abdominal   Peds  Hematology   Anesthesia Other Findings Past Medical History: No date: Abdominal adhesions No date: Asthma without status asthmaticus No date: Cataract of both eyes No date: High myopia No date: IBS (irritable bowel syndrome) No date: Low vision, both eyes No date: PONV (postoperative nausea and vomiting) No date: Retinal detachment  Reproductive/Obstetrics                             Anesthesia Physical Anesthesia Plan  ASA: 2  Anesthesia Plan: General   Post-op Pain Management: Minimal or no pain anticipated   Induction: Intravenous  PONV Risk Score and Plan: Propofol infusion, TIVA and Ondansetron  Airway Management Planned: Nasal Cannula  Additional Equipment: None  Intra-op Plan:   Post-operative Plan:   Informed Consent: I have reviewed the patients History and Physical, chart, labs and  discussed the procedure including the risks, benefits and alternatives for the proposed anesthesia with the patient or authorized representative who has indicated his/her understanding and acceptance.     Dental advisory given  Plan Discussed with: CRNA and Surgeon  Anesthesia Plan Comments: (Discussed risks of anesthesia with patient, including possibility of difficulty with spontaneous ventilation under anesthesia necessitating airway intervention, PONV, and rare risks such as cardiac or respiratory or neurological events, and allergic reactions. Discussed the role of CRNA in patient's perioperative care. Patient understands.)       Anesthesia Quick Evaluation

## 2023-09-19 NOTE — Anesthesia Procedure Notes (Signed)
Procedure Name: MAC Date/Time: 09/19/2023 12:57 PM  Performed by: Lily Lovings, CRNAPre-anesthesia Checklist: Patient identified, Emergency Drugs available, Suction available and Patient being monitored Patient Re-evaluated:Patient Re-evaluated prior to induction Oxygen Delivery Method: Simple face mask Preoxygenation: Pre-oxygenation with 100% oxygen Induction Type: IV induction Comments: pom

## 2023-09-20 ENCOUNTER — Encounter: Payer: Self-pay | Admitting: Internal Medicine

## 2023-09-24 LAB — SURGICAL PATHOLOGY

## 2023-10-02 ENCOUNTER — Ambulatory Visit (INDEPENDENT_AMBULATORY_CARE_PROVIDER_SITE_OTHER): Payer: Medicare Other | Admitting: Nurse Practitioner

## 2023-10-02 ENCOUNTER — Encounter (INDEPENDENT_AMBULATORY_CARE_PROVIDER_SITE_OTHER): Payer: Self-pay | Admitting: Nurse Practitioner

## 2023-10-02 VITALS — BP 106/70 | HR 76 | Resp 18 | Ht 64.0 in | Wt 166.0 lb

## 2023-10-02 DIAGNOSIS — E782 Mixed hyperlipidemia: Secondary | ICD-10-CM | POA: Diagnosis not present

## 2023-10-02 DIAGNOSIS — R252 Cramp and spasm: Secondary | ICD-10-CM

## 2023-10-03 DIAGNOSIS — J45909 Unspecified asthma, uncomplicated: Secondary | ICD-10-CM | POA: Insufficient documentation

## 2023-10-03 NOTE — Progress Notes (Signed)
 Subjective:    Patient ID: Chelsey Price, female    DOB: March 20, 1956, 68 y.o.   MRN: [id number] Chief Complaint  Patient presents with   New Patient (Initial Visit)    np. consult. bilat leg cramps/leg edema. ref: Jyl Railing    The patient is a 68 year old female who presents today for evaluation of pain and cramping in her bilateral lower extremities.  They occur in both legs and they have been ongoing for a very long time.  However within the last year is began to get worse.  She notes that they typically happen nightly but sometimes if she has been very active in the day they can be worse.  She notes that she is unable to wear any high heels because that will just cause cramping without activity.  Initially was thought that her statins were causing this but despite stopping there was any improvement.  She notes that sometimes magnesium and mustard help.  It was also noted by her PCP that she was having swelling in her lower extremities as well.  She does note some lower extremity swelling that comes off and on.  She has a family history of peripheral vascular disease    Review of Systems  Cardiovascular:  Positive for leg swelling.  All other systems reviewed and are negative.      Objective:   Physical Exam Vitals reviewed.  HENT:     Head: Normocephalic.  Cardiovascular:     Rate and Rhythm: Normal rate.     Pulses: Normal pulses.  Pulmonary:     Effort: Pulmonary effort is normal.  Skin:    General: Skin is warm and dry.  Neurological:     Mental Status: She is alert and oriented to person, place, and time.  Psychiatric:        Mood and Affect: Mood normal.        Behavior: Behavior normal.        Thought Content: Thought content normal.        Judgment: Judgment normal.     BP 106/70   Pulse 76   Resp 18   Ht 5' 4 (1.626 m)   Wt 166 lb (75.3 kg)   BMI 28.49 kg/m   Past Medical History:  Diagnosis Date   Abdominal adhesions    Asthma without  status asthmaticus    Cataract of both eyes    High myopia    IBS (irritable bowel syndrome)    Low vision, both eyes    PONV (postoperative nausea and vomiting)    Retinal detachment     Social History   Socioeconomic History   Marital status: Widowed    Spouse name: Not on file   Number of children: Not on file   Years of education: Not on file   Highest education level: Not on file  Occupational History   Not on file  Tobacco Use   Smoking status: Never   Smokeless tobacco: Never  Vaping Use   Vaping status: Never Used  Substance and Sexual Activity   Alcohol use: Never   Drug use: Not Currently   Sexual activity: Not Currently  Other Topics Concern   Not on file  Social History Narrative   Not on file   Social Drivers of Health   Financial Resource Strain: Low Risk  (05/17/2023)   Received from [facility]   Overall Financial Resource Strain (CARDIA)    Difficulty of Paying Living Expenses: Not  hard at all  Food Insecurity: No Food Insecurity (05/17/2023)   Received from [facility]   Hunger Vital Sign    Worried About Running Out of Food in the Last Year: Never true    Ran Out of Food in the Last Year: Never true  Transportation Needs: No Transportation Needs (05/17/2023)   Received from [facility] - Transportation    In the past 12 months, has lack of transportation kept you from medical appointments or from getting medications?: No    Lack of Transportation (Non-Medical): No  Physical Activity: Not on file  Stress: Not on file  Social Connections: Not on file  Intimate Partner Violence: Not on file    Past Surgical History:  Procedure Laterality Date   ABDOMINAL HYSTERECTOMY     BIOPSY  09/19/2023   Procedure: BIOPSY;  Surgeon: Aundria, Ladell POUR, MD;  Location: [facility] ENDOSCOPY;  Service: Gastroenterology;;   COLONOSCOPY WITH PROPOFOL  N/A 04/16/2018   Procedure: COLONOSCOPY WITH PROPOFOL ;   Surgeon: Gaylyn Gladis PENNER, MD;  Location: [facility] ENDOSCOPY;  Service: Endoscopy;  Laterality: N/A;   COLONOSCOPY WITH PROPOFOL  N/A 09/19/2023   Procedure: COLONOSCOPY WITH PROPOFOL ;  Surgeon: Toledo, Ladell POUR, MD;  Location: [facility] ENDOSCOPY;  Service: Gastroenterology;  Laterality: N/A;   ESOPHAGOGASTRODUODENOSCOPY (EGD) WITH PROPOFOL  N/A 04/16/2018   Procedure: ESOPHAGOGASTRODUODENOSCOPY (EGD) WITH PROPOFOL ;  Surgeon: Gaylyn Gladis PENNER, MD;  Location: [facility] ENDOSCOPY;  Service: Endoscopy;  Laterality: N/A;   ESOPHAGOGASTRODUODENOSCOPY (EGD) WITH PROPOFOL  N/A 09/19/2023   Procedure: ESOPHAGOGASTRODUODENOSCOPY (EGD) WITH PROPOFOL ;  Surgeon: Toledo, Ladell POUR, MD;  Location: [facility] ENDOSCOPY;  Service: Gastroenterology;  Laterality: N/A;   EYE SURGERY     KNEE ARTHROSCOPY     PAROTIDECTOMY     POLYPECTOMY  09/19/2023   Procedure: POLYPECTOMY;  Surgeon: Toledo, Ladell POUR, MD;  Location: [facility] ENDOSCOPY;  Service: Gastroenterology;;    Family History  Problem Relation Age of Onset   Heart disease Father    Dementia Father    Diabetes Sister    Lymphoma Maternal Uncle    Breast cancer Maternal Grandmother    Thyroid  disease Sister    Leukemia Cousin     Allergies  Allergen Reactions   Iodine    Shellfish Allergy    Sulfa Antibiotics    Tomato        Latest Ref Rng & Units 12/08/2021   11:52 AM 09/03/2018   10:15 AM  CBC  WBC 4.0 - 10.5 K/uL 4.8  4.7   Hemoglobin 12.0 - 15.0 g/dL 87.8  88.5   Hematocrit 36.0 - 46.0 % 39.2  36.9   Platelets 150 - 400 K/uL 242  264       CMP     Component Value Date/Time   NA 138 12/08/2021 1152   K 4.2 12/08/2021 1152   CL 103 12/08/2021 1152   CO2 29 12/08/2021 1152   GLUCOSE 91 12/08/2021 1152   BUN 17 12/08/2021 1152   CREATININE 0.98 12/08/2021 1152   CALCIUM 9.4 12/08/2021 1152   PROT 7.4 12/08/2021 1152   ALBUMIN  4.5 12/08/2021 1152   AST 25 12/08/2021 1152   ALT 31 12/08/2021 1152   ALKPHOS 61 12/08/2021 1152   BILITOT 0.5  12/08/2021 1152   GFRNONAA >60 12/08/2021 1152     No results found.     Assessment & Plan:   1. Leg cramps (Primary) I had a long discussion with the patient regarding possible causes of cramping.  We discussed that this could be an arterial or venous issue.  Given the swelling that she is having it is recommended that she utilize medical grade compression.  She should place this daily and not sleep in them.  The patient should elevate her lower extremities when possible as well as try to walk 15 to 20 minutes 3 to 4 days a week.  Will have her return with both ABIs and bilateral venous reflux studies.  2. Hyperlipidemia, mixed Continue statin as ordered and reviewed, no changes at this time   Current Outpatient Medications on File Prior to Visit  Medication Sig Dispense Refill   albuterol  (PROVENTIL  HFA;VENTOLIN  HFA) 108 (90 Base) MCG/ACT inhaler Inhale 2 puffs into the lungs every 4 (four) hours as needed for wheezing or shortness of breath.     azelastine (ASTELIN) 0.1 % nasal spray Place into the nose.     betamethasone dipropionate 0.05 % lotion Apply topically as needed.     Cholecalciferol (VITAMIN D3) 50 MCG (2000 UT) capsule Take by mouth.     cyanocobalamin (,VITAMIN B-12,) 1000 MCG/ML injection Inject 1,000 mcg into the muscle every 30 (thirty) days.     dicyclomine  (BENTYL ) 10 MG capsule Take 10 mg by mouth 4 (four) times daily -  before meals and at bedtime.     famotidine (PEPCID) 20 MG tablet Take 20 mg by mouth 2 (two) times daily.     fluticasone  (FLONASE ) 50 MCG/ACT nasal spray Place 2 sprays into both nostrils daily.     Fluticasone -Salmeterol (ADVAIR) 100-50 MCG/DOSE AEPB Inhale 1 puff into the lungs 2 (two) times daily.     ibuprofen (ADVIL,MOTRIN) 600 MG tablet Take 600 mg by mouth every 6 (six) hours.     levothyroxine (SYNTHROID) 25 MCG tablet Take by mouth.     montelukast  (SINGULAIR ) 10 MG tablet Take 10 mg by mouth at bedtime.     mupirocin  ointment  (BACTROBAN ) 2 % Apply 1 Application topically daily. Qd to excision site 22 g 1   Nebulizers (INNOSPIRE ELEGANCE NEBULIZER) MISC See admin instructions.     polyethylene glycol (MIRALAX / GLYCOLAX) packet Take by mouth. Take 17 g by mouth once daily as needed. Mix in 4-8ounces of fluid prior to taking. Pt reports she for IBS flare     rosuvastatin (CRESTOR) 5 MG tablet Take by mouth.     alendronate (FOSAMAX) 70 MG tablet Take by mouth. Haven't picked up yet     pantoprazole (PROTONIX) 40 MG tablet Take 40 mg by mouth daily.  (Patient not taking: Reported on 12/01/2021)     No current facility-administered medications on file prior to visit.    There are no Patient Instructions on file for this visit. No follow-ups on file.   Hailly Fess E Tatum Corl, NP

## 2023-11-12 ENCOUNTER — Other Ambulatory Visit: Payer: Self-pay | Admitting: Gastroenterology

## 2023-11-12 DIAGNOSIS — K449 Diaphragmatic hernia without obstruction or gangrene: Secondary | ICD-10-CM

## 2023-11-12 DIAGNOSIS — K219 Gastro-esophageal reflux disease without esophagitis: Secondary | ICD-10-CM

## 2023-11-12 DIAGNOSIS — R110 Nausea: Secondary | ICD-10-CM

## 2023-11-20 ENCOUNTER — Other Ambulatory Visit (INDEPENDENT_AMBULATORY_CARE_PROVIDER_SITE_OTHER): Payer: Self-pay | Admitting: Nurse Practitioner

## 2023-11-20 DIAGNOSIS — R252 Cramp and spasm: Secondary | ICD-10-CM

## 2023-11-21 ENCOUNTER — Encounter (INDEPENDENT_AMBULATORY_CARE_PROVIDER_SITE_OTHER): Payer: Self-pay | Admitting: Nurse Practitioner

## 2023-11-21 ENCOUNTER — Ambulatory Visit (INDEPENDENT_AMBULATORY_CARE_PROVIDER_SITE_OTHER): Payer: Medicare Other

## 2023-11-21 ENCOUNTER — Ambulatory Visit (INDEPENDENT_AMBULATORY_CARE_PROVIDER_SITE_OTHER): Payer: Medicare Other | Admitting: Nurse Practitioner

## 2023-11-21 VITALS — BP 138/79 | HR 75 | Resp 18 | Ht 63.0 in | Wt 168.0 lb

## 2023-11-21 DIAGNOSIS — R252 Cramp and spasm: Secondary | ICD-10-CM | POA: Diagnosis not present

## 2023-11-21 DIAGNOSIS — E782 Mixed hyperlipidemia: Secondary | ICD-10-CM | POA: Diagnosis not present

## 2023-11-21 NOTE — Progress Notes (Signed)
 Subjective:    Patient ID: Chelsey Price, female    DOB: [birth year], 68 y.o.   MRN: [id number] Chief Complaint  Patient presents with   Follow-up    Pt conv ble venous reflux and abi    The patient is a 68 year old female who presents today for evaluation of pain and cramping in her bilateral lower extremities.  They occur in both legs and they have been ongoing for a very long time.  However within the last year is began to get worse.  She notes that they typically happen nightly but sometimes if she has been very active in the day they can be worse.  She notes that she is unable to wear any high heels because that will just cause cramping without activity.  Initially was thought that her statins were causing this but despite stopping there was any improvement.  She notes that sometimes magnesium and mustard help.  It was also noted by her PCP that she was having swelling in her lower extremities as well.  She does note some lower extremity swelling that comes off and on.  She has a family history of peripheral vascular disease.  Since her last visit the patient has been utilizing medical grade compression stockings diligently daily and this is actually helped her cramping quite a bit.  She notes that when she takes them off or is not wearing them she begins to have cramping again.  Today noninvasive studies show an ABI of 1.23 on the right and 1.19 on the left.  She has strong triphasic tibial artery waveforms bilaterally with normal toe waveforms bilaterally.  She also has normal toe pressures as well.  She underwent a venous reflux today which shows no evidence of DVT or superficial phlebitis bilaterally.  No evidence of deep venous insufficiency or superficial venous reflux bilaterally.    Review of Systems  Cardiovascular:  Positive for leg swelling.  All other systems reviewed and are negative.      Objective:   Physical Exam Vitals reviewed.  HENT:     Head: Normocephalic.   Cardiovascular:     Rate and Rhythm: Normal rate.     Pulses: Normal pulses.  Pulmonary:     Effort: Pulmonary effort is normal.  Skin:    General: Skin is warm and dry.  Neurological:     Mental Status: She is alert and oriented to person, place, and time.  Psychiatric:        Mood and Affect: Mood normal.        Behavior: Behavior normal.        Thought Content: Thought content normal.        Judgment: Judgment normal.     BP 138/79   Pulse 75   Resp 18   Ht 5\' 3"  (1.6 m)   Wt 168 lb (76.2 kg)   BMI 29.76 kg/m   Past Medical History:  Diagnosis Date   Abdominal adhesions    Asthma without status asthmaticus    Cataract of both eyes    High myopia    IBS (irritable bowel syndrome)    Low vision, both eyes    PONV (postoperative nausea and vomiting)    Retinal detachment     Social History   Socioeconomic History   Marital status: Widowed    Spouse name: Not on file   Number of children: Not on file   Years of education: Not on file   Highest education level: Not on  file  Occupational History   Not on file  Tobacco Use   Smoking status: Never   Smokeless tobacco: Never  Vaping Use   Vaping status: Never Used  Substance and Sexual Activity   Alcohol use: Never   Drug use: Not Currently   Sexual activity: Not Currently  Other Topics Concern   Not on file  Social History Narrative   Not on file   Social Drivers of Health   Financial Resource Strain: Low Risk  (11/09/2023)   Received from [facility]   Overall Financial Resource Strain (CARDIA)    Difficulty of Paying Living Expenses: Not hard at all  Food Insecurity: No Food Insecurity (11/09/2023)   Received from [facility]   Hunger Vital Sign    Worried About Running Out of Food in the Last Year: Never true    Ran Out of Food in the Last Year: Never true  Transportation Needs: No Transportation Needs (11/09/2023)   Received from [facility] - Transportation    In the past 12 months, has lack of transportation kept you from medical appointments or from getting medications?: No    Lack of Transportation (Non-Medical): No  Physical Activity: Not on file  Stress: Not on file  Social Connections: Not on file  Intimate Partner Violence: Not on file    Past Surgical History:  Procedure Laterality Date   ABDOMINAL HYSTERECTOMY     BIOPSY  09/19/2023   Procedure: BIOPSY;  Surgeon: Norma Fredrickson, Boykin Nearing, MD;  Location: [facility] ENDOSCOPY;  Service: Gastroenterology;;   COLONOSCOPY WITH PROPOFOL N/A 04/16/2018   Procedure: COLONOSCOPY WITH PROPOFOL;  Surgeon: Christena Deem, MD;  Location: [facility] ENDOSCOPY;  Service: Endoscopy;  Laterality: N/A;   COLONOSCOPY WITH PROPOFOL N/A 09/19/2023   Procedure: COLONOSCOPY WITH PROPOFOL;  Surgeon: Toledo, Boykin Nearing, MD;  Location: [facility] ENDOSCOPY;  Service: Gastroenterology;  Laterality: N/A;   ESOPHAGOGASTRODUODENOSCOPY (EGD) WITH PROPOFOL N/A 04/16/2018   Procedure: ESOPHAGOGASTRODUODENOSCOPY (EGD) WITH PROPOFOL;  Surgeon: Christena Deem, MD;  Location: [facility] ENDOSCOPY;  Service: Endoscopy;  Laterality: N/A;   ESOPHAGOGASTRODUODENOSCOPY (EGD) WITH PROPOFOL N/A 09/19/2023   Procedure: ESOPHAGOGASTRODUODENOSCOPY (EGD) WITH PROPOFOL;  Surgeon: Toledo, Boykin Nearing, MD;  Location: [facility] ENDOSCOPY;  Service: Gastroenterology;  Laterality: N/A;   EYE SURGERY     KNEE ARTHROSCOPY     PAROTIDECTOMY     POLYPECTOMY  09/19/2023   Procedure: POLYPECTOMY;  Surgeon: Toledo, Boykin Nearing, MD;  Location: [facility] ENDOSCOPY;  Service: Gastroenterology;;    Family History  Problem Relation Age of Onset   Heart disease Father    Dementia Father    Diabetes Sister    Lymphoma Maternal Uncle    Breast cancer Maternal Grandmother    Thyroid disease Sister    Leukemia Cousin     Allergies  Allergen Reactions   Iodine    Shellfish Allergy    Sulfa Antibiotics    Tomato        Latest Ref Rng & Units  12/08/2021   11:52 AM 09/03/2018   10:15 AM  CBC  WBC 4.0 - 10.5 K/uL 4.8  4.7   Hemoglobin 12.0 - 15.0 g/dL 08.6  57.8   Hematocrit 36.0 - 46.0 % 39.2  36.9   Platelets 150 - 400 K/uL 242  264       CMP     Component Value Date/Time   NA 138 12/08/2021 1152   K 4.2 12/08/2021 1152  CL 103 12/08/2021 1152   CO2 29 12/08/2021 1152   GLUCOSE 91 12/08/2021 1152   BUN 17 12/08/2021 1152   CREATININE 0.98 12/08/2021 1152   CALCIUM 9.4 12/08/2021 1152   PROT 7.4 12/08/2021 1152   ALBUMIN 4.5 12/08/2021 1152   AST 25 12/08/2021 1152   ALT 31 12/08/2021 1152   ALKPHOS 61 12/08/2021 1152   BILITOT 0.5 12/08/2021 1152   GFRNONAA >60 12/08/2021 1152     No results found.     Assessment & Plan:   1. Leg cramps (Primary) Recommend:  The patient is describing Charley horse type leg cramps. No invasive studies, angiography or surgery at this time.  Magnesium supplementation at bedtime was recommended.    The patient should continue walking and begin a more formal exercise program.   The patient should continue his use of graduated compression socks to control any mild edema.  Patient will return to PCP for further workup and evaluation.  The patient will follow up with me on a PRN basis.   2. Hyperlipidemia, mixed Continue statin as ordered and reviewed, no changes at this time   Current Outpatient Medications on File Prior to Visit  Medication Sig Dispense Refill   albuterol (PROVENTIL HFA;VENTOLIN HFA) 108 (90 Base) MCG/ACT inhaler Inhale 2 puffs into the lungs every 4 (four) hours as needed for wheezing or shortness of breath.     alendronate (FOSAMAX) 70 MG tablet Take by mouth. Haven't picked up yet     betamethasone dipropionate 0.05 % lotion Apply topically as needed.     Cholecalciferol (VITAMIN D3) 50 MCG (2000 UT) capsule Take by mouth.     cyanocobalamin (,VITAMIN B-12,) 1000 MCG/ML injection Inject 1,000 mcg into the muscle every 30 (thirty) days.      dicyclomine (BENTYL) 10 MG capsule Take 10 mg by mouth 4 (four) times daily -  before meals and at bedtime.     famotidine (PEPCID) 20 MG tablet Take 20 mg by mouth 2 (two) times daily.     fluticasone (FLONASE) 50 MCG/ACT nasal spray Place 2 sprays into both nostrils daily.     Fluticasone-Salmeterol (ADVAIR) 100-50 MCG/DOSE AEPB Inhale 1 puff into the lungs 2 (two) times daily.     ibuprofen (ADVIL,MOTRIN) 600 MG tablet Take 600 mg by mouth every 6 (six) hours.     levothyroxine (SYNTHROID) 25 MCG tablet Take by mouth.     montelukast (SINGULAIR) 10 MG tablet Take 10 mg by mouth at bedtime.     mupirocin ointment (BACTROBAN) 2 % Apply 1 Application topically daily. Qd to excision site 22 g 1   Nebulizers (INNOSPIRE ELEGANCE NEBULIZER) MISC See admin instructions.     polyethylene glycol (MIRALAX / GLYCOLAX) packet Take by mouth. Take 17 g by mouth once daily as needed. Mix in 4-8ounces of fluid prior to taking. Pt reports she for IBS flare     rosuvastatin (CRESTOR) 5 MG tablet Take by mouth.     azelastine (ASTELIN) 0.1 % nasal spray Place into the nose.     pantoprazole (PROTONIX) 40 MG tablet Take 40 mg by mouth daily.  (Patient not taking: Reported on 12/01/2021)     No current facility-administered medications on file prior to visit.    There are no Patient Instructions on file for this visit. No follow-ups on file.   Georgiana Spinner, NP

## 2023-11-22 LAB — VAS US ABI WITH/WO TBI
Left ABI: 1.19
Right ABI: 1.23

## 2023-12-06 ENCOUNTER — Ambulatory Visit
Admission: RE | Admit: 2023-12-06 | Discharge: 2023-12-06 | Disposition: A | Discharge status: Home / Self Care | Source: Ambulatory Visit | Attending: Gastroenterology | Admitting: Gastroenterology

## 2023-12-06 DIAGNOSIS — K219 Gastro-esophageal reflux disease without esophagitis: Secondary | ICD-10-CM

## 2023-12-06 DIAGNOSIS — R110 Nausea: Secondary | ICD-10-CM

## 2023-12-06 DIAGNOSIS — K449 Diaphragmatic hernia without obstruction or gangrene: Secondary | ICD-10-CM

## 2023-12-24 ENCOUNTER — Encounter: Payer: Self-pay | Admitting: Surgery

## 2023-12-24 ENCOUNTER — Ambulatory Visit (INDEPENDENT_AMBULATORY_CARE_PROVIDER_SITE_OTHER): Admitting: Surgery

## 2023-12-24 VITALS — BP 136/82 | HR 67 | Temp 97.9°F | Ht 63.0 in | Wt 168.2 lb

## 2023-12-24 DIAGNOSIS — K219 Gastro-esophageal reflux disease without esophagitis: Secondary | ICD-10-CM

## 2023-12-24 DIAGNOSIS — K449 Diaphragmatic hernia without obstruction or gangrene: Secondary | ICD-10-CM | POA: Diagnosis not present

## 2023-12-24 NOTE — Patient Instructions (Signed)
 We have scheduled you for a CT Scan of your Abdomen and Pelvis with contrast. This has been scheduled at [facility] on 12/28/23 . Please arrive there by 2:15pm . If you need to reschedule your Scan, you may do so by calling [phone]. Please let us  know if you reschedule your scan as we have to get authorization from your insurance for this.       Hiatal Hernia  A hiatal hernia occurs when part of the stomach slides above the muscle that separates the abdomen from the chest (diaphragm). A person can be born with a hiatal hernia (congenital), or it may develop over time. In almost all cases of hiatal hernia, only the top part of the stomach pushes through the diaphragm. Many people have a hiatal hernia with no symptoms. The larger the hernia, the more likely it is that you will have symptoms. In some cases, a hiatal hernia allows stomach acid to flow back into the tube that carries food from your mouth to your stomach (esophagus). This may cause heartburn symptoms. The development of heartburn symptoms may mean that you have a condition called gastroesophageal reflux disease (GERD). What are the causes? This condition is caused by a weakness in the opening (hiatus) where the esophagus passes through the diaphragm to attach to the upper part of the stomach. A person may be born with a weakness in the hiatus, or a weakness can develop over time. What increases the risk? This condition is more likely to develop in: Older people. Age is a major risk factor for a hiatal hernia, especially if you are over the age of 32. Pregnant women. People who are overweight. People who have frequent constipation. What are the signs or symptoms? Symptoms of this condition usually develop in the form of GERD symptoms. Symptoms include: Heartburn. Upset stomach (indigestion). Trouble swallowing. Coughing or wheezing. Wheezing is making high-pitched whistling sounds when you breathe. Sore  throat. Chest pain. Nausea and vomiting. How is this diagnosed? This condition may be diagnosed during testing for GERD. Tests that may be done include: X-rays of your stomach or chest. An upper gastrointestinal (GI) series. This is an X-ray exam of your GI tract that is taken after you swallow a chalky liquid that shows up clearly on the X-ray. Endoscopy. This is a procedure to look into your stomach using a thin, flexible tube that has a tiny camera and light on the end of it. How is this treated? This condition may be treated by: Dietary and lifestyle changes to help reduce GERD symptoms. Medicines. These may include: Over-the-counter antacids. Medicines that make your stomach empty more quickly. Medicines that block the production of stomach acid (H2 blockers). Stronger medicines to reduce stomach acid (proton pump inhibitors). Surgery to repair the hernia, if other treatments are not helping. If you have no symptoms, you may not need treatment. Follow these instructions at home: Lifestyle and activity Do not use any products that contain nicotine or tobacco. These products include cigarettes, chewing tobacco, and vaping devices, such as e-cigarettes. If you need help quitting, ask your health care provider. Try to achieve and maintain a healthy body weight. Avoid putting pressure on your abdomen. Anything that puts pressure on your abdomen increases the amount of acid that may be pushed up into your esophagus. Avoid bending over, especially after eating. Raise the head of your bed by putting blocks under the legs. This keeps your head and esophagus higher than your stomach. Do not  wear tight clothing around your chest or stomach. Try not to strain when having a bowel movement, when urinating, or when lifting heavy objects. Eating and drinking Avoid foods that can worsen GERD symptoms. These may include: Fatty foods, like fried foods. Citrus fruits, like oranges or lemon. Other  foods and drinks that contain acid, like orange juice or tomatoes. Spicy food. Chocolate. Eat frequent small meals instead of three large meals a day. This helps prevent your stomach from getting too full. Eat slowly. Do not lie down right after eating. Do not eat 1-2 hours before bed. Do not drink beverages with caffeine. These include cola, coffee, cocoa, and tea. Do not drink alcohol. General instructions Take over-the-counter and prescription medicines only as told by your health care provider. Keep all follow-up visits. Your health care provider will want to check that any new prescribed medicines are helping your symptoms. Contact a health care provider if: Your symptoms are not controlled with medicines or lifestyle changes. You are having trouble swallowing. You have coughing or wheezing that will not go away. Your pain is getting worse. Your pain spreads to your arms, neck, jaw, teeth, or back. You feel nauseous or you vomit. Get help right away if: You have shortness of breath. You vomit blood. You have bright red blood in your stools. You have black, tarry stools. These symptoms may be an emergency. Get help right away. Call 911. Do not wait to see if the symptoms will go away. Do not drive yourself to the hospital. Summary A hiatal hernia occurs when part of the stomach slides above the muscle that separates the abdomen from the chest. A person may be born with a weakness in the hiatus, or a weakness can develop over time. Symptoms of a hiatal hernia may include heartburn, trouble swallowing, or sore throat. Management of a hiatal hernia includes eating frequent small meals instead of three large meals a day. Get help right away if you vomit blood, have bright red blood in your stools, or have black, tarry stools. This information is not intended to replace advice given to you by your health care provider. Make sure you discuss any questions you have with your health care  provider. Document Revised: 10/11/2021 Document Reviewed: 10/11/2021 Elsevier Patient Education  2024 [employer].

## 2023-12-25 ENCOUNTER — Other Ambulatory Visit: Payer: Self-pay | Admitting: *Deleted

## 2023-12-25 ENCOUNTER — Telehealth: Payer: Self-pay | Admitting: *Deleted

## 2023-12-25 MED ORDER — PREDNISONE 50 MG PO TABS: ORAL_TABLET | ORAL | 0 refills | Status: DC

## 2023-12-25 NOTE — Progress Notes (Signed)
 Patient ID: Chelsey Price, female   DOB: 1956/07/10, 68 y.o.   MRN: [id number]  HPI Chelsey Price is a 68 y.o. female  Seen in consultation at the request of Dr. Mamie Searles for hiatal hernia and that is symptomatic.  She experiences reflux symptoms as well as some cough and severe nausea he still continues to have reflux with significant belching and a bad taste in her mouth. She feels that she has regurgitation daily.  He has been on Pepcid Protonix and Zofran  without significant relief.  Negative for dysphagia SHe Did have an episode recently where she lost her voice He does have a history of IBS with periods of diarrhea followed by constipation. She did have a EGD that have personally reviewed showing a hiatal hernia and she also had a barium swallow that I have also personally reviewed showing small hiatal hernia without evidence of any strictures.  She did have a CT 2 years ago that I have personally reviewed showing small sliding hiatal hernia. She is able to perform more than 4 METS of activity without any shortness of breath or chest pain. She is retired from [employer] and worked at [employer] department She does have a history of abdominal hysterectomy with bilateral oophorectomies and multiple lysis of adhesions.  She has had at least 3 pelvic surgeries in the past She reports a normal bone marrow biopsy when she was a teen for her anemia. She also reports that her mom had severe anemia with hemoglobin levels in the 3-4 range requiring multiple transfusion.  Hemoglobin couple years ago was 12 HPI  Past Medical History:  Diagnosis Date   Abdominal adhesions    Asthma without status asthmaticus    Cataract of both eyes    High myopia    IBS (irritable bowel syndrome)    Low vision, both eyes    PONV (postoperative nausea and vomiting)    Retinal detachment     Past Surgical History:  Procedure Laterality Date   ABDOMINAL HYSTERECTOMY     BIOPSY  09/19/2023   Procedure: BIOPSY;   Surgeon: Corky Diener, Alphonsus Jeans, MD;  Location: [facility] ENDOSCOPY;  Service: Gastroenterology;;   COLONOSCOPY WITH PROPOFOL  N/A 04/16/2018   Procedure: COLONOSCOPY WITH PROPOFOL ;  Surgeon: Deveron Fly, MD;  Location: [facility] ENDOSCOPY;  Service: Endoscopy;  Laterality: N/A;   COLONOSCOPY WITH PROPOFOL  N/A 09/19/2023   Procedure: COLONOSCOPY WITH PROPOFOL ;  Surgeon: Toledo, Alphonsus Jeans, MD;  Location: [facility] ENDOSCOPY;  Service: Gastroenterology;  Laterality: N/A;   ESOPHAGOGASTRODUODENOSCOPY (EGD) WITH PROPOFOL  N/A 04/16/2018   Procedure: ESOPHAGOGASTRODUODENOSCOPY (EGD) WITH PROPOFOL ;  Surgeon: Deveron Fly, MD;  Location: [facility] ENDOSCOPY;  Service: Endoscopy;  Laterality: N/A;   ESOPHAGOGASTRODUODENOSCOPY (EGD) WITH PROPOFOL  N/A 09/19/2023   Procedure: ESOPHAGOGASTRODUODENOSCOPY (EGD) WITH PROPOFOL ;  Surgeon: Toledo, Alphonsus Jeans, MD;  Location: [facility] ENDOSCOPY;  Service: Gastroenterology;  Laterality: N/A;   EYE SURGERY     KNEE ARTHROSCOPY     PAROTIDECTOMY     POLYPECTOMY  09/19/2023   Procedure: POLYPECTOMY;  Surgeon: Toledo, Alphonsus Jeans, MD;  Location: [facility] ENDOSCOPY;  Service: Gastroenterology;;    Family History  Problem Relation Age of Onset   Heart disease Father    Dementia Father    Diabetes Sister    Lymphoma Maternal Uncle    Breast cancer Maternal Grandmother    Thyroid  disease Sister    Leukemia Cousin     Social History Social History   Tobacco Use   Smoking status: Never   Smokeless  tobacco: Never  Vaping Use   Vaping status: Never Used  Substance Use Topics   Alcohol use: Never   Drug use: Not Currently    Allergies  Allergen Reactions   Iodine    Shellfish Allergy    Sulfa Antibiotics    Tomato     Current Outpatient Medications  Medication Sig Dispense Refill   pantoprazole (PROTONIX) 40 MG tablet Take 40 mg by mouth daily.     albuterol  (PROVENTIL  HFA;VENTOLIN  HFA) 108 (90 Base) MCG/ACT inhaler Inhale 2 puffs into the lungs every 4 (four) hours as needed  for wheezing or shortness of breath.     alendronate (FOSAMAX) 70 MG tablet Take by mouth. Haven't picked up yet     azelastine (ASTELIN) 0.1 % nasal spray Place into the nose.     betamethasone dipropionate 0.05 % lotion Apply topically as needed.     Cholecalciferol (VITAMIN D3) 50 MCG (2000 UT) capsule Take by mouth.     cyanocobalamin (,VITAMIN B-12,) 1000 MCG/ML injection Inject 1,000 mcg into the muscle every 30 (thirty) days.     dicyclomine (BENTYL) 10 MG capsule Take 10 mg by mouth 4 (four) times daily -  before meals and at bedtime.     famotidine (PEPCID) 20 MG tablet Take 20 mg by mouth 2 (two) times daily.     fluticasone (FLONASE) 50 MCG/ACT nasal spray Place 2 sprays into both nostrils daily.     Fluticasone-Salmeterol (ADVAIR) 100-50 MCG/DOSE AEPB Inhale 1 puff into the lungs 2 (two) times daily.     ibuprofen (ADVIL,MOTRIN) 600 MG tablet Take 600 mg by mouth every 6 (six) hours.     levothyroxine (SYNTHROID) 25 MCG tablet Take by mouth.     montelukast (SINGULAIR) 10 MG tablet Take 10 mg by mouth at bedtime.     mupirocin  ointment (BACTROBAN ) 2 % Apply 1 Application topically daily. Qd to excision site 22 g 1   Nebulizers (INNOSPIRE ELEGANCE NEBULIZER) MISC See admin instructions.     polyethylene glycol (MIRALAX / GLYCOLAX) packet Take by mouth. Take 17 g by mouth once daily as needed. Mix in 4-8ounces of fluid prior to taking. Pt reports she for IBS flare     No current facility-administered medications for this visit.     Review of Systems Full ROS  was asked and was negative except for the information on the HPI  Physical Exam Blood pressure 136/82, pulse 67, temperature 97.9 F (36.6 C), temperature source Oral, height 5\' 3"  (1.6 m), weight 168 lb 3.2 oz (76.3 kg), SpO2 99%. CONSTITUTIONAL: NAD. EYES: Pupils are equal, round,  Sclera are non-icteric. EARS, NOSE, MOUTH AND THROAT: The oropharynx is clear. The oral mucosa is pink and moist. Hearing is intact to  voice. LYMPH NODES:  Lymph nodes in the neck are normal. RESPIRATORY:  Lungs are clear. There is normal respiratory effort, with equal breath sounds bilaterally, and without pathologic use of accessory muscles. CARDIOVASCULAR: Heart is regular without murmurs, gallops, or rubs. GI: The abdomen is  soft, nontender, and nondistended. There are no palpable masses. There is no hepatosplenomegaly. There are normal bowel sounds in all quadrants.  Pfannenstiel scar from prior hysterectomy GU: Rectal deferred.   MUSCULOSKELETAL: Normal muscle strength and tone. No cyanosis or edema.   SKIN: Turgor is good and there are no pathologic skin lesions or ulcers. NEUROLOGIC: Motor and sensation is grossly normal. Cranial nerves are grossly intact. PSYCH:  Oriented to person, place and time. Affect is normal.  Data Reviewed  I have personally reviewed the patient's imaging, laboratory findings and medical records.    Assessment/Plan Cheznie is a very pleasant 68 year old female with significant reflux and hiatal hernia.  Cussed with her in detail about her disease process.  I believe that she probably has a medium size hiatal hernia that is causing some of the reflux and the chronic nausea.  Discussed with her in detail about the next steps in workup.  Do think that we need to obtain a dedicated CT scan of the abdomen pelvis with contrast to further delineate her anatomy.  I will be happy to see her after she completes this.  We also talk in detail about a robotic hiatal hernia repair and fundoplication and she seems to be interested.  We talked about the risk the benefits and the possible complications. I will see her back after she completes her appropriate workup.  Please note that I spent 60 minutes in this encounter including personally reviewing imaging studies, reviewing medical records extensively, coordinating her care, placing orders and performing documentation      Evelia Hipp, MD FACS General  Surgeon 12/25/2023, 7:41 AM

## 2023-12-25 NOTE — Telephone Encounter (Signed)
 Called patient to let her know her instructions before her CT scan:  Prednisone 50mg  (sent to pharmacy)  Take 1 tablet 13 hr, 7hr, 1hr before contrast injection  Over the counter Benadryl 50mg : take 1hr before contrast injection.

## 2023-12-28 ENCOUNTER — Other Ambulatory Visit

## 2023-12-31 ENCOUNTER — Ambulatory Visit
Admission: RE | Admit: 2023-12-31 | Discharge: 2023-12-31 | Disposition: A | Discharge status: Home / Self Care | Source: Ambulatory Visit | Attending: Surgery | Admitting: Surgery

## 2023-12-31 DIAGNOSIS — K449 Diaphragmatic hernia without obstruction or gangrene: Secondary | ICD-10-CM | POA: Diagnosis present

## 2023-12-31 MED ORDER — IOHEXOL 9 MG/ML PO SOLN
500.0000 mL | ORAL | Status: AC
  Administered 2023-12-31: 500 mL via ORAL

## 2023-12-31 MED ORDER — IOHEXOL 300 MG/ML  SOLN
100.0000 mL | Freq: Once | INTRAMUSCULAR | Status: AC | PRN
Start: 2023-12-31 — End: 2023-12-31
  Administered 2023-12-31: 100 mL via INTRAVENOUS

## 2024-01-07 ENCOUNTER — Ambulatory Visit (INDEPENDENT_AMBULATORY_CARE_PROVIDER_SITE_OTHER): Admitting: Surgery

## 2024-01-07 ENCOUNTER — Encounter: Payer: Self-pay | Admitting: Surgery

## 2024-01-07 ENCOUNTER — Telehealth: Payer: Self-pay | Admitting: Surgery

## 2024-01-07 VITALS — BP 111/77 | HR 80 | Ht 63.0 in | Wt 167.0 lb

## 2024-01-07 DIAGNOSIS — K449 Diaphragmatic hernia without obstruction or gangrene: Secondary | ICD-10-CM | POA: Diagnosis not present

## 2024-01-07 DIAGNOSIS — K219 Gastro-esophageal reflux disease without esophagitis: Secondary | ICD-10-CM

## 2024-01-07 NOTE — H&P (View-Only) (Signed)
 Outpatient Surgical Follow Up  01/07/2024  Chelsey Price is an 68 y.o. female.   Chief Complaint  Patient presents with   Routine Post Op    HPI:  Chelsey Price is a 68 y.o. female f/u for hiatal hernia and that is symptomatic.  She experiences reflux symptoms as well as some cough and severe nausea She still continues to have reflux with significant belching and a bad taste in her mouth. She feels that she has regurgitation daily.  He has been on Pepcid Protonix and Zofran  without significant relief.  Negative for dysphagia SHe Did have an episode recently where she lost her voice He does have a history of IBS with periods of diarrhea followed by constipation. She did have a EGD that have personally reviewed showing a hiatal hernia and she also had a barium swallow that I have also personally reviewed showing small hiatal hernia without evidence of any strictures.  She did have a CT  that I have personally reviewed showing small sliding hiatal hernia. She is able to perform more than 4 METS of activity without any shortness of breath or chest pain. She is retired from [employer] and worked at [employer] department She does have a history of abdominal hysterectomy with bilateral oophorectomies and multiple lysis of adhesions.  She has had at least 3 pelvic surgeries in the past She reports a normal bone marrow biopsy when she was a teen for her anemia. She also reports that her mom had severe anemia with hemoglobin levels in the 3-4 range requiring multiple transfusion.  Hemoglobin couple years ago was 12  Past Medical History:  Diagnosis Date   Abdominal adhesions    Asthma without status asthmaticus    Cataract of both eyes    High myopia    IBS (irritable bowel syndrome)    Low vision, both eyes    PONV (postoperative nausea and vomiting)    Retinal detachment     Past Surgical History:  Procedure Laterality Date   ABDOMINAL HYSTERECTOMY     BIOPSY  09/19/2023    Procedure: BIOPSY;  Surgeon: Corky Diener, Alphonsus Jeans, MD;  Location: [facility] ENDOSCOPY;  Service: Gastroenterology;;   COLONOSCOPY WITH PROPOFOL  N/A 04/16/2018   Procedure: COLONOSCOPY WITH PROPOFOL ;  Surgeon: Deveron Fly, MD;  Location: [facility] ENDOSCOPY;  Service: Endoscopy;  Laterality: N/A;   COLONOSCOPY WITH PROPOFOL  N/A 09/19/2023   Procedure: COLONOSCOPY WITH PROPOFOL ;  Surgeon: Toledo, Alphonsus Jeans, MD;  Location: [facility] ENDOSCOPY;  Service: Gastroenterology;  Laterality: N/A;   ESOPHAGOGASTRODUODENOSCOPY (EGD) WITH PROPOFOL  N/A 04/16/2018   Procedure: ESOPHAGOGASTRODUODENOSCOPY (EGD) WITH PROPOFOL ;  Surgeon: Deveron Fly, MD;  Location: [facility] ENDOSCOPY;  Service: Endoscopy;  Laterality: N/A;   ESOPHAGOGASTRODUODENOSCOPY (EGD) WITH PROPOFOL  N/A 09/19/2023   Procedure: ESOPHAGOGASTRODUODENOSCOPY (EGD) WITH PROPOFOL ;  Surgeon: Toledo, Alphonsus Jeans, MD;  Location: [facility] ENDOSCOPY;  Service: Gastroenterology;  Laterality: N/A;   EYE SURGERY     KNEE ARTHROSCOPY     PAROTIDECTOMY     POLYPECTOMY  09/19/2023   Procedure: POLYPECTOMY;  Surgeon: Toledo, Alphonsus Jeans, MD;  Location: [facility] ENDOSCOPY;  Service: Gastroenterology;;    Family History  Problem Relation Age of Onset   Heart disease Father    Dementia Father    Diabetes Sister    Lymphoma Maternal Uncle    Breast cancer Maternal Grandmother    Thyroid  disease Sister    Leukemia Cousin     Social History:  reports that she has never smoked. She has never been  exposed to tobacco smoke. She has never used smokeless tobacco. She reports that she does not currently use drugs. She reports that she does not drink alcohol.  Allergies:  Allergies  Allergen Reactions   Iodine    Shellfish Allergy    Sulfa Antibiotics    Tomato     Medications reviewed.    ROS Full ROS performed and is otherwise negative other than what is stated in HPI   BP 111/77   Pulse 80   Ht 5\' 3"  (1.6 m)   Wt 167 lb (75.8 kg)   SpO2 98%   BMI 29.58 kg/m    Physical Exam   CONSTITUTIONAL: NAD. EYES: Pupils are equal, round,  Sclera are non-icteric. EARS, NOSE, MOUTH AND THROAT: The oropharynx is clear. The oral mucosa is pink and moist. Hearing is intact to voice. LYMPH NODES:  Lymph nodes in the neck are normal. RESPIRATORY:  Lungs are clear. There is normal respiratory effort, with equal breath sounds bilaterally, and without pathologic use of accessory muscles. CARDIOVASCULAR: Heart is regular without murmurs, gallops, or rubs. GI: The abdomen is  soft, nontender, and nondistended. There are no palpable masses. There is no hepatosplenomegaly. There are normal bowel sounds in all quadrants.  Pfannenstiel scar from prior hysterectomy GU: Rectal deferred.   MUSCULOSKELETAL: Normal muscle strength and tone. No cyanosis or edema.   SKIN: Turgor is good and there are no pathologic skin lesions or ulcers. NEUROLOGIC: Motor and sensation is grossly normal. Cranial nerves are grossly intact. PSYCH:  Oriented to person, place and time. Affect is normal.   Data Reviewed   I have personally reviewed the patient's imaging, laboratory findings and medical records.     Assessment/Plan Chelsey Price is a very pleasant 68 year old female with significant reflux and hiatal hernia.  I DisCussed with her in detail about her disease process.  I believe that she probably has a medium size hiatal hernia that is causing some of the reflux and the chronic nausea.  I do think her HH is symptomatic and merits surgical repair w fundoplications./  We also talk in detail about a robotic hiatal hernia repair and fundoplication .  We talked about the risks, ( esophageal, bowel injuries, chronic pain, bloating) the benefits and the possible complications. I also was very candid with her, , I do not know if nausea will improve with repair but reflux sxs for sure will improve. She seems to suffer from both . We also d/w her about diet modifications Plan Robotic HH repair w  Nissen fundoplications   Please note that I spent 40 minutes in this encounter including personally reviewing imaging studies, reviewing medical records extensively, coordinating her care, placing orders and performing documentation       Evelia Hipp, MD [facility] General Surgeon

## 2024-01-07 NOTE — Telephone Encounter (Signed)
 Patient has been advised of Pre-Admission date/time, and Surgery date at [facility].  Surgery Date: 01/22/24 Preadmission Testing Date: 01/14/24 (phone 8a-1p)  Patient has been informed of the scheduling process and surgery information is given at time of office visit.   Patient has been made aware to call [phone], between 1-3:00pm the day before surgery, to find out what time to arrive for surgery.

## 2024-01-07 NOTE — Progress Notes (Signed)
 Outpatient Surgical Follow Up  01/07/2024  Chelsey Price is an 68 y.o. female.   Chief Complaint  Patient presents with   Routine Post Op    HPI:  Chelsey Price is a 68 y.o. female f/u for hiatal hernia and that is symptomatic.  She experiences reflux symptoms as well as some cough and severe nausea She still continues to have reflux with significant belching and a bad taste in her mouth. She feels that she has regurgitation daily.  He has been on Pepcid Protonix and Zofran  without significant relief.  Negative for dysphagia SHe Did have an episode recently where she lost her voice He does have a history of IBS with periods of diarrhea followed by constipation. She did have a EGD that have personally reviewed showing a hiatal hernia and she also had a barium swallow that I have also personally reviewed showing small hiatal hernia without evidence of any strictures.  She did have a CT  that I have personally reviewed showing small sliding hiatal hernia. She is able to perform more than 4 METS of activity without any shortness of breath or chest pain. She is retired from [employer] and worked at [employer] department She does have a history of abdominal hysterectomy with bilateral oophorectomies and multiple lysis of adhesions.  She has had at least 3 pelvic surgeries in the past She reports a normal bone marrow biopsy when she was a teen for her anemia. She also reports that her mom had severe anemia with hemoglobin levels in the 3-4 range requiring multiple transfusion.  Hemoglobin couple years ago was 12  Past Medical History:  Diagnosis Date   Abdominal adhesions    Asthma without status asthmaticus    Cataract of both eyes    High myopia    IBS (irritable bowel syndrome)    Low vision, both eyes    PONV (postoperative nausea and vomiting)    Retinal detachment     Past Surgical History:  Procedure Laterality Date   ABDOMINAL HYSTERECTOMY     BIOPSY  09/19/2023    Procedure: BIOPSY;  Surgeon: Corky Diener, Alphonsus Jeans, MD;  Location: [facility] ENDOSCOPY;  Service: Gastroenterology;;   COLONOSCOPY WITH PROPOFOL  N/A 04/16/2018   Procedure: COLONOSCOPY WITH PROPOFOL ;  Surgeon: Deveron Fly, MD;  Location: [facility] ENDOSCOPY;  Service: Endoscopy;  Laterality: N/A;   COLONOSCOPY WITH PROPOFOL  N/A 09/19/2023   Procedure: COLONOSCOPY WITH PROPOFOL ;  Surgeon: Toledo, Alphonsus Jeans, MD;  Location: [facility] ENDOSCOPY;  Service: Gastroenterology;  Laterality: N/A;   ESOPHAGOGASTRODUODENOSCOPY (EGD) WITH PROPOFOL  N/A 04/16/2018   Procedure: ESOPHAGOGASTRODUODENOSCOPY (EGD) WITH PROPOFOL ;  Surgeon: Deveron Fly, MD;  Location: [facility] ENDOSCOPY;  Service: Endoscopy;  Laterality: N/A;   ESOPHAGOGASTRODUODENOSCOPY (EGD) WITH PROPOFOL  N/A 09/19/2023   Procedure: ESOPHAGOGASTRODUODENOSCOPY (EGD) WITH PROPOFOL ;  Surgeon: Toledo, Alphonsus Jeans, MD;  Location: [facility] ENDOSCOPY;  Service: Gastroenterology;  Laterality: N/A;   EYE SURGERY     KNEE ARTHROSCOPY     PAROTIDECTOMY     POLYPECTOMY  09/19/2023   Procedure: POLYPECTOMY;  Surgeon: Toledo, Alphonsus Jeans, MD;  Location: [facility] ENDOSCOPY;  Service: Gastroenterology;;    Family History  Problem Relation Age of Onset   Heart disease Father    Dementia Father    Diabetes Sister    Lymphoma Maternal Uncle    Breast cancer Maternal Grandmother    Thyroid  disease Sister    Leukemia Cousin     Social History:  reports that she has never smoked. She has never been  exposed to tobacco smoke. She has never used smokeless tobacco. She reports that she does not currently use drugs. She reports that she does not drink alcohol.  Allergies:  Allergies  Allergen Reactions   Iodine    Shellfish Allergy    Sulfa Antibiotics    Tomato     Medications reviewed.    ROS Full ROS performed and is otherwise negative other than what is stated in HPI   BP 111/77   Pulse 80   Ht 5\' 3"  (1.6 m)   Wt 167 lb (75.8 kg)   SpO2 98%   BMI 29.58 kg/m    Physical Exam   CONSTITUTIONAL: NAD. EYES: Pupils are equal, round,  Sclera are non-icteric. EARS, NOSE, MOUTH AND THROAT: The oropharynx is clear. The oral mucosa is pink and moist. Hearing is intact to voice. LYMPH NODES:  Lymph nodes in the neck are normal. RESPIRATORY:  Lungs are clear. There is normal respiratory effort, with equal breath sounds bilaterally, and without pathologic use of accessory muscles. CARDIOVASCULAR: Heart is regular without murmurs, gallops, or rubs. GI: The abdomen is  soft, nontender, and nondistended. There are no palpable masses. There is no hepatosplenomegaly. There are normal bowel sounds in all quadrants.  Pfannenstiel scar from prior hysterectomy GU: Rectal deferred.   MUSCULOSKELETAL: Normal muscle strength and tone. No cyanosis or edema.   SKIN: Turgor is good and there are no pathologic skin lesions or ulcers. NEUROLOGIC: Motor and sensation is grossly normal. Cranial nerves are grossly intact. PSYCH:  Oriented to person, place and time. Affect is normal.   Data Reviewed   I have personally reviewed the patient's imaging, laboratory findings and medical records.     Assessment/Plan [name] is a very pleasant 68 year old female with significant reflux and hiatal hernia.  I DisCussed with her in detail about her disease process.  I believe that she probably has a medium size hiatal hernia that is causing some of the reflux and the chronic nausea.  I do think her HH is symptomatic and merits surgical repair w fundoplications./  We also talk in detail about a robotic hiatal hernia repair and fundoplication .  We talked about the risks, ( esophageal, bowel injuries, chronic pain, bloating) the benefits and the possible complications. I also was very candid with her, , I do not know if nausea will improve with repair but reflux sxs for sure will improve. She seems to suffer from both . We also d/w her about diet modifications Plan Robotic HH repair w  Nissen fundoplications   Please note that I spent 40 minutes in this encounter including personally reviewing imaging studies, reviewing medical records extensively, coordinating her care, placing orders and performing documentation       Evelia Hipp, MD [facility] General Surgeon

## 2024-01-07 NOTE — Patient Instructions (Addendum)
 We have spoken today about repairing your Hiatal Hernia. Your surgery will be scheduled at [facility] with Dr. Dana Duncan.  Plan to be in the hospital for 1-2 days if the minimally invasive surgery is completed without having to make a bigger incision. If the bigger incision is made, you will most likely need to be in the hospital 4-6 days. You will be on a soft diet and need to recover for 2 weeks following your surgery prior to doing any of your normal activities. At the 2 week mark, we will see you in the office and if you are doing ok we will advance your diet and activity level as you tolerate.  Please see your Blue (Pre-care) Sheet for more information regarding your surgery. Our surgery scheduler will call you to verify surgery date and to go over information  You will need to arrange to be out of work for approximately 1-2 weeks and then you may return with a lifting restriction for 4 more weeks. If you have FMLA or Disability paperwork that needs to be filled out, please have your company fax your paperwork to [phone] or you may drop this by either office. This paperwork will be filled out within 3 days after your surgery has been completed.  Please call our office with any questions or concerns that you have regarding your surgery and recovery.    Laparoscopic Nissen Fundoplication Laparoscopic Nissen fundoplication is surgery to relieve heartburn and other problems caused by gastric fluids flowing up into your esophagus. The esophagus is the tube that carries food and liquid from your throat to your stomach. Normally, the muscle that sits between your stomach and your esophagus (lower esophageal sphincter or LES) keeps stomach fluids in your stomach. In some people, the LES does not work properly, and stomach fluids flow up into the esophagus. This can happen when part of the stomach bulges through the LES (hiatal hernia). The backward flow of stomach fluids can cause a type of severe and  long-standing heartburn that is called gastroesophageal reflux disease (GERD). You may need this surgery if other treatments for GERD have not helped. In a laparoscopic Nissen fundoplication, the upper part of your stomach is wrapped around the lower part of your esophagus to strengthen the LES and prevent reflux. If you have a hiatal hernia, it will also be repaired with this surgery. The procedure is done through several small incisions in your abdomen. It is performed using a thin, telescopic instrument (laparoscope) and other instruments that can pass through the scope or through other small incisions. Tell a health care provider about: Any allergies you have. All medicines you are taking, including vitamins, herbs, eye drops, creams, and over-the-counter medicines. Any problems you or family members have had with anesthetic medicines. Any blood disorders you have. Any surgeries you have had. Any medical conditions you have. What are the risks? Generally, this is a safe procedure. However, problems may occur, including: Difficulty swallowing (dysphagia). Bloating. Nausea or vomiting. Damage to the lung, causing a collapsed lung. Infection or bleeding. What happens before the procedure? Ask your health care provider about: Changing or stopping your regular medicines. This is especially important if you are taking diabetes medicines or blood thinners. Taking medicines such as aspirin and ibuprofen. These medicines can thin your blood. Do not take these medicines before your procedure if your health care provider asks you not to. Follow your health care provider's instructions about eating or drinking restrictions. Plan to have someone take  you home after the procedure. What happens during the procedure? An IV tube will be inserted into one of your veins. It will be used to give you fluids and medicines during the procedure. You will be given a medicine that makes you fall asleep (general  anesthetic). Your abdomen will be cleaned with a germ-killing solution (antiseptic). The surgeon will make a small incision in your abdomen and insert a tube through the incision. Your abdomen will be filled with a gas. This helps the surgeon to see your organs more easily and it makes more space to work. The surgeon will insert the laparoscope through the incision. The scope has a camera that will send pictures to a monitor in the operating room. The surgeon will make several other small incisions in your abdomen to insert the other instruments that are needed during the procedure. Another instrument (dilator) will be passed through your mouth and down your esophagus into the upper part of your stomach. The dilator will prevent your LES from being closed too tightly during surgery. The surgeon will pass the top portion of your stomach behind the lower part of your esophagus and wrap it all the way around. This will be stitched into place. If you have a hiatal hernia, it will be repaired during this procedure. All instruments will be removed, and your incisions will be closed under your skin with stitches (sutures). Skin adhesive strips may also be used. A bandage (dressing) will be placed on your skin over the incisions. The procedure may vary among health care providers and hospitals. What happens after the procedure? You will be moved to a recovery area. Your blood pressure, heart rate, breathing rate, and blood oxygen level will be monitored often until the medicines you were given have worn off. You will be given pain medicine as needed. Your IV tube will be kept in until you are able to drink fluids. This information is not intended to replace advice given to you by your health care provider. Make sure you discuss any questions you have with your health care provider. Document Released: 09/04/2014 Document Revised: 01/20/2016 Document Reviewed: 04/15/2014 Else[employer] Interactive Patient  Education  2017 [employer]   Laparoscopic Nissen Fundoplication, Care After Refer to this sheet in the next few weeks. These instructions provide you with information about caring for yourself after your procedure. Your health care provider may also give you more specific instructions. Your treatment has been planned according to current medical practices, but problems sometimes occur. Call your health care provider if you have any problems or questions after your procedure. What can I expect after the procedure? After the procedure, it is common to have: Difficulty swallowing (dysphagia). Excess gas (bloating). Follow these instructions at home: Medicines  Take medicines only as directed by your health care provider. Do not drive or operate heavy machinery while taking pain medicine. Incision care  There are many different ways to close and cover an incision, including stitches (sutures), skin glue, and adhesive strips. Follow your health care provider's instructions about: Incision care. Bandage (dressing) changes and removal. Incision closure removal. Check your incision areas every day for signs of infection. Watch for: Redness, swelling, or pain. Fluid, blood, or pus. Do not take baths, swim, or use a hot tub until your health care provider approves. Take showers as directed by your health care provider. Eating and drinking  Follow your health care provider's instructions about eating. You may need to follow a very soft diet for 2  weeks, followed by a diet of more regular foods for 2 weeks, no breads. You should return to your usual diet gradually. Drink enough fluid to keep your urine clear or pale yellow. Activity  Return to your normal activities as directed by your health care provider. Ask your health care provider what activities are safe for you. Avoid strenuous exercise. Do not lift anything that is heavier than 10 lb (4.5 kg). Ask your health care provider when you  can: Return to sexual activity. Drive. Go back to work. Contact a health care provider if: You have a fever. Your pain gets worse or is not helped by medicine. You have frequent nausea or vomiting. You have continued abdominal bloating. You have an ongoing (persistent) cough. You have redness, swelling, or pain in any incision areas. You have fluid, blood, or pus coming from any incisions. Get help right away if: You have trouble breathing. You are unable to swallow. You have persistent vomiting. You have blood in your vomit. You have severe abdominal pain.  Diet After Nissen Fundoplication Surgery This diet information is for patients who have recently had Nissen fundoplication surgery to correct reflux disease or to repair various types of hernias, such as hiatal hernia and intrathoracic stomach. This diet may also be used for other gastrointestinal surgeries, such as Heller myotomy and repair of achalasia. The diet will help control diarrhea, excess gas and swallowing problems, which may occur after this type of surgery. Keeping Your Stomach from Stretching Eat small, frequent meals (six to eight per day). This will help you consume the majority of the nutrients you need without causing your stomach to feel full or distended.  Drinking large amounts of fluids with meals can stretch your stomach. You may drink fluids between meals as often as you like, but limit fluids to 1/2 cup (4 fluid ounces) with meals and one cup (8 fluid ounces) with snacks.  Sit upright while eating and stay upright for 30 minutes after each meal. Gravity can help food move through your digestive tract. Do not lie down after eating. Sit upright for 2 hours after your last meal or snack of the day.  Eat very slowly. Take your time when eating.  Take small bites and chew your food well to help aid in swallowing and digestion.  Avoid crusty breads and sticky, gummy foods, such as bananas, fresh doughy breads, rolls  and doughnuts. These types of foods become sticky and difficult to swallow.  Toasted breads tend to be better tolerated.  Lastly, if you eat sweets, consume them at the end of your meal to avoid a group of symptoms referred to as "dumping syndrome". This describes the rapid emptying of foods from the stomach to the small intestine. Sweetened beverages, candy and desserts move more rapidly and dump quickly into the intestines. This can cause symptoms of nausea, weakness, cold sweats, cramps, diarrhea and dizzy spells.  Avoiding Gas Avoid drinking through a straw. Do not chew gum or tobacco. These actions cause you to swallow air, which produces excess gas in your stomach. Chew with your mouth closed.  Avoid any foods that cause stomach gas and distention. These foods include corn, dried beans, peas, lentils, onions, broccoli, cauliflower and any food from the cabbage family.  Avoid carbonated drinks, alcohol, citrus and tomato products.  When will I be able to eat a soft diet? After Nissen fundoplication surgery, your diet will be advanced slowly by your surgeon. Generally, you will be on a clear  liquid diet for the first few meals. Then you will advance to the full liquid diet for a meal or two and eventually to a Nissen soft diet. Please be aware that each patient's tolerance to food is different. Your doctor will advance your diet depending on how well you progress after surgery. Clear Liquid Diet  The first diet after surgery is the clear liquid diet. It includes the following liquids: Apple juice  Cranberry juice  Grape juice  Chicken broth  Beef broth  Flavored gelatin (Jell-O)  Decaf tea and coffee  Caffeinated beverages are permitted based on tolerance  Popsicles  [place] ice Carbonated drinks (sodas) are not allowed for the first six to eight weeks after surgery. After this time you can try them again in small amounts.  Full Liquid Diet The full liquid diet contains anything on  the clear liquid diet, plus: Milk, soy, rice and almond (no chocolate)  Cream of wheat, cream of rice, grits  Strained creamed soups (no tomato or broccoli)  Vanilla and strawberry-flavored ice cream  Sherbet  Blended, custard styled or whipped yogurt (plain or vanilla only)  Vanilla and butterscotch pudding (no chocolate or coconut)  Nutritional drinks including Ensure, Boost, Carnation Instant Breakfast (no chocolate-flavored) Note: Dairy products, such as milk, ice cream and pudding, may cause diarrhea in some people just after surgery. You may need to avoid milk products. If so, substitute them with lactose-free beverages, such as soy, rice, Lactaid or almond milks.  Nissen Soft Diet Food Category Foods to Choose Foods to Avoid  Beverages Milk, such as, whole, 2%, 1%, non-fat, or skim, soy, rice, almond  Caffeinated and decaf tea and coffee  Powdered drink mixes (in moderation)  Non-citrus juices (apple, grape, cranberry or blends of these)  Fruit nectars  Nutritional drinks including Boost, Ensure, Carnation Instant Breakfast Chocolate milk, cocoa or other chocolate-flavored drinks  Carbonated drinks  Alcohol  Citrus juices like orange, grapefruit, lemon and lime  Breads Crackers (saltine, butter, soda, graham, Goldfish and Cheese Nips)   Untoasted bread, bagels, [place] and hard rolls, English muffins  Crackers with nuts, seeds, fresh or dried fruit, coconut, or highly seasoned, such as garlic or onion-flavored  Sweet rolls, coffee cake or doughnuts  Cereals Well cooked cereals, such as oatmeal (plain or flavored)  Cold cereal (Cornflakes, Rice Krispies, Cheerios, Special K plain, Rice Chex and puffed rice) Very coarse cereal, such as bran, shredded wheat  Any cereal with fresh or dried fruit, coconut, seeds or nuts  Desserts Eat in moderation and do not eat desserts or sweets by themselves. Plain cakes, cookies and cream-filled pies  Vanilla and butterscotch pudding  or custard  Ice cream, ice milk, frozen yogurt and sherbet  Gelatin made from allowed foods  Fruit ices and popsicles Desserts containing chocolate, coconut, nuts, seeds, fresh or dried fruit, peppermint or spearmint  Eggs  Poached, hard boiled or scrambled Fried eggs and highly seasoned eggs (deviled eggs)  Fats Eat in moderation. Butter and margarine  Mayonnaise and vegetable oils  Mildly seasoned cream sauces and gravies  Plain cream cheese  Sour cream Highly seasoned salad dressings, cream sauces and gravies  Bacon, bacon fat, ham fat, lard and salt pork  Fried foods  Nuts  Fruits Fruit juice  Any canned or cooked fruit except those listed in the AVOID column ALL fresh fruits, such as citrus, apples, and pineapple  Canned pineapple  Dried fruits, such as raisins, berries  Fruits with seeds, such as berries, kiwi  and figs  Meat, Fish, Poultry, and Dairy Products Meats may be ground, minced or chopped to ease swallowing and digestion  Tender, well cooked and moist cuts of beef, chicken, [place] and pork  Veal and lamb  Flaky, cooked fish  Canned tuna  Cottage and ricotta cheeses  Mild cheese, such as American, brick, mozzarella and baby Swiss  Creamy peanut butter  Plain custard or blended fruit yogurt  Moist casseroles, such as macaroni & cheese, tuna noodle  Grilled or toasted cheese sandwich Tough meats with a lot of gristle  Fried, highly seasoned, smoked and fatty meat, fish or poultry, such as frankfurters, luncheon meats, sausage, bacon, spare ribs, beef brisket, sardines, anchovies, duck and goose  Chili and other entrees made with pepper or chili pepper  Shellfish  Strongly flavored cheeses, such as sharp cheese, extra sharp cheddar, cheese containing peppers or other seasonings  Crunchy peanut butter  Any yogurt with nuts, seeds, coconut, strawberries or raspberries  Potatoes and Starches Peeled, mashed or boiled white or sweet potatoes  Oven-baked potatoes without  skin  Well cooked white rice, enriched noodles, barley, spaghetti, macaroni and other pastas Fried potatoes, potato skins and potato chips  Hard and soft taco shells  Fried, brown or wild rice  Soups Mildly flavored meat stocks  Cream soups made from allowed foods Highly seasoned soups and tomato based soups, cream soups made with gas producing vegetables, such as broccoli, cauliflower, onion, etc.  Sweets and Snacks Use in moderation and do not eat large amounts of sweets by themselves. Syrup, honey, jelly and seedless jam  Plain hard candies and plain candies made with allowed ingredients  Molasses  Marshmallows  Other candy made from allowed ingredients  Thin pretzels Jam, marmalade and preserves  Chocolate in any form  Any candy containing nuts, coconut, seeds, peppermint, spearmint or dried or fresh fruit  Popcorn, potato chips, tortilla chips  Soft or hard thick pretzels, such as sourdough  Vegetables Well cooked soft vegetables without seeds or skins, such as asparagus tips, beets, carrots, green and wax beans, chopped spinach, tender canned baby peas, squash and pumpkin Raw vegetables, tomatoes, tomato juice, tomato sauce and V-8 juice(tomato based products can irritate the stomach)  Gas producing vegetables, such as broccoli, Brussel sprouts, cabbage, cauliflower, onions, corn, cucumber, green peppers, rutabagas, turnips, radishes and sauerkraut  Dried beans, peas and lentils  Miscellaneous Salt and spices in moderation  Mustard and vinegar in moderation Fried or highly seasoned foods  Coconut and seeds  Pickles and olives  Chili sauces, ketchup, barbecue sauce, horseradish, black pepper, chili powder and onion and garlic seasonings  Any other strongly flavored seasoning, condiment, spice or herb not tolerated  Any food not tolerated

## 2024-01-14 ENCOUNTER — Encounter
Admission: RE | Admit: 2024-01-14 | Discharge: 2024-01-14 | Disposition: A | Discharge status: Home / Self Care | Source: Ambulatory Visit | Attending: Surgery | Admitting: Surgery

## 2024-01-14 ENCOUNTER — Other Ambulatory Visit: Payer: Self-pay

## 2024-01-14 VITALS — Ht 63.0 in | Wt 165.0 lb

## 2024-01-14 DIAGNOSIS — Z01810 Encounter for preprocedural cardiovascular examination: Secondary | ICD-10-CM

## 2024-01-14 DIAGNOSIS — E782 Mixed hyperlipidemia: Secondary | ICD-10-CM

## 2024-01-14 DIAGNOSIS — Z01812 Encounter for preprocedural laboratory examination: Secondary | ICD-10-CM

## 2024-01-14 HISTORY — DX: Pneumonia, unspecified organism: J18.9

## 2024-01-14 HISTORY — DX: Mixed hyperlipidemia: E78.2

## 2024-01-14 HISTORY — DX: Deficiency of other specified B group vitamins: E53.8

## 2024-01-14 HISTORY — DX: Anemia, unspecified: D64.9

## 2024-01-14 NOTE — Patient Instructions (Addendum)
 Your procedure is scheduled on: Tuesday, May 27 Report to the Registration Desk on the 1st floor of the [facility] To find out your arrival time, please call [phone] between 1PM - 3PM on: Friday, May 23 If your arrival time is 6:00 am, do not arrive before that time as the [facility] entrance doors do not open until 6:00 am.  REMEMBER: Instructions that are not followed completely may result in serious medical risk, up to and including death; or upon the discretion of your surgeon and anesthesiologist your surgery may need to be rescheduled.  Do not eat food after midnight the night before surgery.  No gum chewing or hard candies.  You may however, drink CLEAR liquids up to 2 hours before you are scheduled to arrive for your surgery. Do not drink anything within 2 hours of your scheduled arrival time.  Clear liquids include: - water  - apple juice without pulp - gatorade (not RED colors) - black coffee or tea (Do NOT add milk or creamers to the coffee or tea) Do NOT drink anything that is not on this list.  One week prior to surgery: starting May 20 Stop Anti-inflammatories (NSAIDS) such as Advil, Aleve, Ibuprofen, Motrin, Naproxen, Naprosyn and Aspirin based products such as Excedrin, Goody's Powder, BC Powder. Stop ANY OVER THE COUNTER supplements until after surgery. Stop biotin, vitamin D.  You may however, continue to take Tylenol if needed for pain up until the day of surgery.  Continue taking all of your other prescription medications up until the day of surgery.  ON THE DAY OF SURGERY ONLY TAKE THESE MEDICATIONS WITH SIPS OF WATER:  famotidine (PEPCID)  pantoprazole (PROTONIX) Advair inhaler  Use inhalers on the day of surgery and bring your albuterol  inhaler to the hospital.  No Alcohol for 24 hours before or after surgery.  No Smoking including e-cigarettes for 24 hours before surgery.  No chewable tobacco products for at least 6 hours before surgery.  No  nicotine patches on the day of surgery.  Do not use any "recreational" drugs for at least a week (preferably 2 weeks) before your surgery.  Please be advised that the combination of cocaine and anesthesia may have negative outcomes, up to and including death. If you test positive for cocaine, your surgery will be cancelled.  On the morning of surgery brush your teeth with toothpaste and water, you may rinse your mouth with mouthwash if you wish. Do not swallow any toothpaste or mouthwash.  Use CHG Soap as directed on instruction sheet.  Do not wear jewelry, make-up, hairpins, clips or nail polish.  For welded (permanent) jewelry: bracelets, anklets, waist bands, etc.  Please have this removed prior to surgery.  If it is not removed, there is a chance that hospital personnel will need to cut it off on the day of surgery.  Do not wear lotions, powders, or perfumes.   Do not shave body hair from the neck down 48 hours before surgery.  Contact lenses, hearing aids and dentures may not be worn into surgery.  Do not bring valuables to the hospital. [facility] is not responsible for any missing/lost belongings or valuables.   Notify your doctor if there is any change in your medical condition (cold, fever, infection).  Wear comfortable clothing (specific to your surgery type) to the hospital.  After surgery, you can help prevent lung complications by doing breathing exercises.  Take deep breaths and cough every 1-2 hours. Your doctor may order  a device called an Incentive Spirometer to help you take deep breaths. When coughing or sneezing, hold a pillow firmly against your incision with both hands. This is called "splinting." Doing this helps protect your incision. It also decreases belly discomfort.  If you are being admitted to the hospital overnight, leave your suitcase in the car. After surgery it may be brought to your room.  In case of increased patient census, it may be necessary  for you, the patient, to continue your postoperative care in the Same Day Surgery department.  If you are being discharged the day of surgery, you will not be allowed to drive home. You will need a responsible individual to drive you home and stay with you for 24 hours after surgery.   If you are taking public transportation, you will need to have a responsible individual with you.  Please call the Pre-admissions Testing Dept. at [phone] if you have any questions about these instructions.  Surgery Visitation Policy:  Patients having surgery or a procedure may have two visitors.  Children under the age of 69 must have an adult with them who is not the patient.  Inpatient Visitation:    Visiting hours are 7 a.m. to 8 p.m. Up to four visitors are allowed at one time in a patient room. The visitors may rotate out with other people during the day.  One visitor age 60 or older may stay with the patient overnight and must be in the room by 8 p.m.      Preparing for Surgery with CHLORHEXIDINE GLUCONATE (CHG) Soap  Chlorhexidine Gluconate (CHG) Soap  o An antiseptic cleaner that kills germs and bonds with the skin to continue killing germs even after washing  o Used for showering the night before surgery and morning of surgery  Before surgery, you can play an important role by reducing the number of germs on your skin.  CHG (Chlorhexidine gluconate) soap is an antiseptic cleanser which kills germs and bonds with the skin to continue killing germs even after washing.  Please do not use if you have an allergy to CHG or antibacterial soaps. If your skin becomes reddened/irritated stop using the CHG.  1. Shower the NIGHT BEFORE SURGERY and the MORNING OF SURGERY with CHG soap.  2. If you choose to wash your hair, wash your hair first as usual with your normal shampoo.  3. After shampooing, rinse your hair and body thoroughly to remove the shampoo.  4. Use CHG as you would any  other liquid soap. You can apply CHG directly to the skin and wash gently with a scrungie or a clean washcloth.  5. Apply the CHG soap to your body only from the neck down. Do not use on open wounds or open sores. Avoid contact with your eyes, ears, mouth, and genitals (private parts). Wash face and genitals (private parts) with your normal soap.  6. Wash thoroughly, paying special attention to the area where your surgery will be performed.  7. Thoroughly rinse your body with warm water.  8. Do not shower/wash with your normal soap after using and rinsing off the CHG soap.  9. Pat yourself dry with a clean towel.  10. Wear clean pajamas to bed the night before surgery.  12. Place clean sheets on your bed the night of your first shower and do not sleep with pets.  13. Shower again with the CHG soap on the day of surgery prior to arriving at the hospital.  14. Do not apply any deodorants/lotions/powders.  15. Please wear clean clothes to the hospital.

## 2024-01-15 ENCOUNTER — Encounter
Admission: RE | Admit: 2024-01-15 | Discharge: 2024-01-15 | Disposition: A | Discharge status: Home / Self Care | Source: Ambulatory Visit | Attending: Surgery | Admitting: Surgery

## 2024-01-15 ENCOUNTER — Encounter (INDEPENDENT_AMBULATORY_CARE_PROVIDER_SITE_OTHER): Payer: Self-pay

## 2024-01-15 DIAGNOSIS — Z01810 Encounter for preprocedural cardiovascular examination: Secondary | ICD-10-CM | POA: Diagnosis not present

## 2024-01-15 DIAGNOSIS — Z01812 Encounter for preprocedural laboratory examination: Secondary | ICD-10-CM

## 2024-01-15 DIAGNOSIS — Z01818 Encounter for other preprocedural examination: Secondary | ICD-10-CM | POA: Diagnosis present

## 2024-01-15 DIAGNOSIS — E782 Mixed hyperlipidemia: Secondary | ICD-10-CM | POA: Diagnosis not present

## 2024-01-15 DIAGNOSIS — K449 Diaphragmatic hernia without obstruction or gangrene: Secondary | ICD-10-CM | POA: Diagnosis not present

## 2024-01-15 LAB — BASIC METABOLIC PANEL WITH GFR
Anion gap: 10 (ref 5–15)
BUN: 20 mg/dL (ref 8–23)
CO2: 26 mmol/L (ref 22–32)
Calcium: 9.2 mg/dL (ref 8.9–10.3)
Chloride: 103 mmol/L (ref 98–111)
Creatinine, Ser: 1.03 mg/dL — ABNORMAL HIGH (ref 0.44–1.00)
GFR, Estimated: 59 mL/min — ABNORMAL LOW (ref >60)
Glucose, Bld: 74 mg/dL (ref 70–99)
Potassium: 4.1 mmol/L (ref 3.5–5.1)
Sodium: 139 mmol/L (ref 135–145)

## 2024-01-15 LAB — CBC WITH DIFFERENTIAL/PLATELET
Abs Immature Granulocytes: 0.02 10*3/uL (ref 0.00–0.07)
Basophils Absolute: 0.1 10*3/uL (ref 0.0–0.1)
Basophils Relative: 1 %
Eosinophils Absolute: 0.2 10*3/uL (ref 0.0–0.5)
Eosinophils Relative: 3 %
HCT: 36.7 % (ref 36.0–46.0)
Hemoglobin: 11.6 g/dL — ABNORMAL LOW (ref 12.0–15.0)
Immature Granulocytes: 0 %
Lymphocytes Relative: 33 %
Lymphs Abs: 2 10*3/uL (ref 0.7–4.0)
MCH: 26.5 pg (ref 26.0–34.0)
MCHC: 31.6 g/dL (ref 30.0–36.0)
MCV: 84 fL (ref 80.0–100.0)
Monocytes Absolute: 0.3 10*3/uL (ref 0.1–1.0)
Monocytes Relative: 6 %
Neutro Abs: 3.4 10*3/uL (ref 1.7–7.7)
Neutrophils Relative %: 57 %
Platelets: 257 10*3/uL (ref 150–400)
RBC: 4.37 MIL/uL (ref 3.87–5.11)
RDW: 14.1 % (ref 11.5–15.5)
WBC: 6 10*3/uL (ref 4.0–10.5)
nRBC: 0 % (ref 0.0–0.2)

## 2024-01-22 ENCOUNTER — Observation Stay
Admission: RE | Admit: 2024-01-22 | Discharge: 2024-01-23 | Disposition: A | Discharge status: Home / Self Care | Attending: Surgery | Admitting: Surgery

## 2024-01-22 ENCOUNTER — Ambulatory Visit: Admitting: Urgent Care

## 2024-01-22 ENCOUNTER — Other Ambulatory Visit: Payer: Self-pay

## 2024-01-22 ENCOUNTER — Encounter: Payer: Self-pay | Admitting: Surgery

## 2024-01-22 ENCOUNTER — Ambulatory Visit: Admitting: Anesthesiology

## 2024-01-22 ENCOUNTER — Encounter
Admission: RE | Disposition: A | Discharge status: Home / Self Care | Payer: Self-pay | Source: Home / Self Care | Attending: Surgery

## 2024-01-22 DIAGNOSIS — K449 Diaphragmatic hernia without obstruction or gangrene: Secondary | ICD-10-CM | POA: Diagnosis not present

## 2024-01-22 DIAGNOSIS — J45909 Unspecified asthma, uncomplicated: Secondary | ICD-10-CM | POA: Diagnosis not present

## 2024-01-22 DIAGNOSIS — Z8719 Personal history of other diseases of the digestive system: Principal | ICD-10-CM

## 2024-01-22 DIAGNOSIS — K219 Gastro-esophageal reflux disease without esophagitis: Secondary | ICD-10-CM | POA: Diagnosis not present

## 2024-01-22 HISTORY — PX: XI ROBOTIC ASSISTED PARAESOPHAGEAL HERNIA REPAIR: SHX6871

## 2024-01-22 LAB — CBC
HCT: 34.5 % — ABNORMAL LOW (ref 36.0–46.0)
Hemoglobin: 10.8 g/dL — ABNORMAL LOW (ref 12.0–15.0)
MCH: 26.5 pg (ref 26.0–34.0)
MCHC: 31.3 g/dL (ref 30.0–36.0)
MCV: 84.6 fL (ref 80.0–100.0)
Platelets: 227 10*3/uL (ref 150–400)
RBC: 4.08 MIL/uL (ref 3.87–5.11)
RDW: 14 % (ref 11.5–15.5)
WBC: 8 10*3/uL (ref 4.0–10.5)
nRBC: 0 % (ref 0.0–0.2)

## 2024-01-22 LAB — CREATININE, SERUM
Creatinine, Ser: 1.05 mg/dL — ABNORMAL HIGH (ref 0.44–1.00)
GFR, Estimated: 58 mL/min — ABNORMAL LOW (ref >60)

## 2024-01-22 LAB — HIV ANTIBODY (ROUTINE TESTING W REFLEX): HIV Screen 4th Generation wRfx: NONREACTIVE

## 2024-01-22 SURGERY — REPAIR, HERNIA, PARAESOPHAGEAL, ROBOT-ASSISTED
Anesthesia: General | Site: Abdomen

## 2024-01-22 MED ORDER — 0.9 % SODIUM CHLORIDE (POUR BTL) OPTIME
TOPICAL | Status: DC | PRN
  Administered 2024-01-22: 500 mL

## 2024-01-22 MED ORDER — ONDANSETRON 4 MG PO TBDP: 4.0000 mg | ORAL_TABLET | Freq: Four times a day (QID) | ORAL | Status: DC | PRN

## 2024-01-22 MED ORDER — ROCURONIUM BROMIDE 100 MG/10ML IV SOLN
INTRAVENOUS | Status: DC | PRN
  Administered 2024-01-22: 20 mg via INTRAVENOUS
  Administered 2024-01-22: 60 mg via INTRAVENOUS

## 2024-01-22 MED ORDER — BUPIVACAINE LIPOSOME 1.3 % IJ SUSP
INTRAMUSCULAR | Status: AC
  Filled 2024-01-22: qty 20

## 2024-01-22 MED ORDER — FLUTICASONE FUROATE-VILANTEROL 100-25 MCG/ACT IN AEPB
1.0000 | INHALATION_SPRAY | Freq: Every day | RESPIRATORY_TRACT | Status: DC
  Filled 2024-01-22: qty 28

## 2024-01-22 MED ORDER — CEFAZOLIN SODIUM-DEXTROSE 2-4 GM/100ML-% IV SOLN
INTRAVENOUS | Status: AC
  Filled 2024-01-22: qty 100

## 2024-01-22 MED ORDER — KETAMINE HCL 50 MG/5ML IJ SOSY
PREFILLED_SYRINGE | INTRAMUSCULAR | Status: DC | PRN
  Administered 2024-01-22: 30 mg via INTRAVENOUS

## 2024-01-22 MED ORDER — DEXAMETHASONE SODIUM PHOSPHATE 10 MG/ML IJ SOLN
INTRAMUSCULAR | Status: AC
  Filled 2024-01-22: qty 1

## 2024-01-22 MED ORDER — PROPOFOL 10 MG/ML IV BOLUS
INTRAVENOUS | Status: DC | PRN
  Administered 2024-01-22: 150 mg via INTRAVENOUS

## 2024-01-22 MED ORDER — LIDOCAINE HCL (PF) 2 % IJ SOLN
INTRAMUSCULAR | Status: AC
  Filled 2024-01-22: qty 5

## 2024-01-22 MED ORDER — CHLORHEXIDINE GLUCONATE 0.12 % MT SOLN
15.0000 mL | Freq: Once | OROMUCOSAL | Status: AC
  Administered 2024-01-22: 15 mL via OROMUCOSAL

## 2024-01-22 MED ORDER — KETAMINE HCL 50 MG/5ML IJ SOSY
PREFILLED_SYRINGE | INTRAMUSCULAR | Status: AC
  Filled 2024-01-22: qty 5

## 2024-01-22 MED ORDER — ROCURONIUM BROMIDE 10 MG/ML (PF) SYRINGE
PREFILLED_SYRINGE | INTRAVENOUS | Status: AC
  Filled 2024-01-22: qty 10

## 2024-01-22 MED ORDER — FENTANYL CITRATE (PF) 100 MCG/2ML IJ SOLN
INTRAMUSCULAR | Status: AC
  Filled 2024-01-22: qty 2

## 2024-01-22 MED ORDER — DIPHENHYDRAMINE HCL 12.5 MG/5ML PO ELIX: 12.5000 mg | ORAL_SOLUTION | Freq: Four times a day (QID) | ORAL | Status: DC | PRN

## 2024-01-22 MED ORDER — BUPIVACAINE-EPINEPHRINE 0.25% -1:200000 IJ SOLN
INTRAMUSCULAR | Status: DC | PRN
  Administered 2024-01-22: 30 mL

## 2024-01-22 MED ORDER — ACETAMINOPHEN 500 MG PO TABS
ORAL_TABLET | ORAL | Status: AC
  Filled 2024-01-22: qty 2

## 2024-01-22 MED ORDER — EPHEDRINE SULFATE-NACL 50-0.9 MG/10ML-% IV SOSY
PREFILLED_SYRINGE | INTRAVENOUS | Status: DC | PRN
  Administered 2024-01-22: 5 mg via INTRAVENOUS

## 2024-01-22 MED ORDER — CHLORHEXIDINE GLUCONATE CLOTH 2 % EX PADS
6.0000 | MEDICATED_PAD | Freq: Once | CUTANEOUS | Status: AC
  Administered 2024-01-22: 6 via TOPICAL

## 2024-01-22 MED ORDER — SEVOFLURANE IN SOLN
RESPIRATORY_TRACT | Status: AC
  Filled 2024-01-22: qty 250

## 2024-01-22 MED ORDER — FLUTICASONE PROPIONATE 50 MCG/ACT NA SUSP: 2.0000 | Freq: Every day | NASAL | Status: DC | PRN

## 2024-01-22 MED ORDER — FENTANYL CITRATE (PF) 100 MCG/2ML IJ SOLN: 25.0000 ug | INTRAMUSCULAR | Status: DC | PRN

## 2024-01-22 MED ORDER — ALBUMIN HUMAN 5 % IV SOLN
INTRAVENOUS | Status: AC
  Filled 2024-01-22: qty 250

## 2024-01-22 MED ORDER — MIDAZOLAM HCL 2 MG/2ML IJ SOLN
INTRAMUSCULAR | Status: AC
  Filled 2024-01-22: qty 2

## 2024-01-22 MED ORDER — DEXMEDETOMIDINE HCL IN NACL 80 MCG/20ML IV SOLN
INTRAVENOUS | Status: DC | PRN
  Administered 2024-01-22: 8 ug via INTRAVENOUS
  Administered 2024-01-22: 4 ug via INTRAVENOUS

## 2024-01-22 MED ORDER — PROCHLORPERAZINE EDISYLATE 10 MG/2ML IJ SOLN
5.0000 mg | Freq: Four times a day (QID) | INTRAMUSCULAR | Status: DC | PRN
  Administered 2024-01-23: 10 mg via INTRAVENOUS
  Filled 2024-01-22 (×2): qty 2

## 2024-01-22 MED ORDER — ALBUTEROL SULFATE (2.5 MG/3ML) 0.083% IN NEBU: 3.0000 mL | INHALATION_SOLUTION | RESPIRATORY_TRACT | Status: DC | PRN

## 2024-01-22 MED ORDER — ACETAMINOPHEN 160 MG/5ML PO SOLN
1000.0000 mg | Freq: Four times a day (QID) | ORAL | Status: DC
  Administered 2024-01-22 – 2024-01-23 (×3): 1000 mg via ORAL
  Filled 2024-01-22 (×6): qty 40.6

## 2024-01-22 MED ORDER — MONTELUKAST SODIUM 10 MG PO TABS
10.0000 mg | ORAL_TABLET | Freq: Every day | ORAL | Status: DC
  Administered 2024-01-22: 10 mg via ORAL
  Filled 2024-01-22: qty 1

## 2024-01-22 MED ORDER — DEXAMETHASONE SODIUM PHOSPHATE 10 MG/ML IJ SOLN
INTRAMUSCULAR | Status: DC | PRN
Start: 2024-01-22 — End: 2024-01-22
  Administered 2024-01-22: 10 mg via INTRAVENOUS

## 2024-01-22 MED ORDER — PROPOFOL 1000 MG/100ML IV EMUL
INTRAVENOUS | Status: AC
  Filled 2024-01-22: qty 100

## 2024-01-22 MED ORDER — MIDAZOLAM HCL 2 MG/2ML IJ SOLN
INTRAMUSCULAR | Status: DC | PRN
  Administered 2024-01-22: 1 mg via INTRAVENOUS

## 2024-01-22 MED ORDER — FENTANYL CITRATE (PF) 100 MCG/2ML IJ SOLN
INTRAMUSCULAR | Status: DC | PRN
  Administered 2024-01-22: 100 ug via INTRAVENOUS

## 2024-01-22 MED ORDER — OXYCODONE HCL 5 MG PO TABS
5.0000 mg | ORAL_TABLET | Freq: Once | ORAL | Status: AC | PRN
  Administered 2024-01-22: 5 mg via ORAL

## 2024-01-22 MED ORDER — SUCCINYLCHOLINE CHLORIDE 200 MG/10ML IV SOSY
PREFILLED_SYRINGE | INTRAVENOUS | Status: AC
  Filled 2024-01-22: qty 10

## 2024-01-22 MED ORDER — OXYCODONE HCL 5 MG/5ML PO SOLN: 5.0000 mg | Freq: Once | ORAL | Status: AC | PRN

## 2024-01-22 MED ORDER — CEFAZOLIN SODIUM-DEXTROSE 2-4 GM/100ML-% IV SOLN
2.0000 g | Freq: Three times a day (TID) | INTRAVENOUS | Status: DC
  Administered 2024-01-22 – 2024-01-23 (×4): 2 g via INTRAVENOUS
  Filled 2024-01-22 (×4): qty 100

## 2024-01-22 MED ORDER — CEFAZOLIN SODIUM-DEXTROSE 2-4 GM/100ML-% IV SOLN
2.0000 g | INTRAVENOUS | Status: AC
  Administered 2024-01-22: 2 g via INTRAVENOUS

## 2024-01-22 MED ORDER — PROCHLORPERAZINE MALEATE 10 MG PO TABS: 10.0000 mg | ORAL_TABLET | Freq: Four times a day (QID) | ORAL | Status: DC | PRN

## 2024-01-22 MED ORDER — ONDANSETRON HCL 4 MG/2ML IJ SOLN
INTRAMUSCULAR | Status: DC | PRN
  Administered 2024-01-22: 4 mg via INTRAVENOUS

## 2024-01-22 MED ORDER — BUPIVACAINE LIPOSOME 1.3 % IJ SUSP
INTRAMUSCULAR | Status: DC | PRN
  Administered 2024-01-22: 20 mL

## 2024-01-22 MED ORDER — VISTASEAL 10 ML SINGLE DOSE KIT
PACK | CUTANEOUS | Status: AC
  Filled 2024-01-22: qty 10

## 2024-01-22 MED ORDER — GABAPENTIN 300 MG PO CAPS
300.0000 mg | ORAL_CAPSULE | ORAL | Status: AC
  Administered 2024-01-22: 300 mg via ORAL

## 2024-01-22 MED ORDER — ONDANSETRON HCL 4 MG/2ML IJ SOLN
INTRAMUSCULAR | Status: AC
  Filled 2024-01-22: qty 2

## 2024-01-22 MED ORDER — OXYCODONE HCL 5 MG PO TABS
ORAL_TABLET | ORAL | Status: AC
  Filled 2024-01-22: qty 1

## 2024-01-22 MED ORDER — HYDRALAZINE HCL 20 MG/ML IJ SOLN: 10.0000 mg | INTRAMUSCULAR | Status: DC | PRN

## 2024-01-22 MED ORDER — LIDOCAINE HCL (CARDIAC) PF 100 MG/5ML IV SOSY
PREFILLED_SYRINGE | INTRAVENOUS | Status: DC | PRN
  Administered 2024-01-22: 60 mg via INTRAVENOUS

## 2024-01-22 MED ORDER — PHENYLEPHRINE HCL-NACL 20-0.9 MG/250ML-% IV SOLN
INTRAVENOUS | Status: AC
  Filled 2024-01-22: qty 250

## 2024-01-22 MED ORDER — SPY AGENT GREEN - (INDOCYANINE FOR INJECTION)
INTRAMUSCULAR | Status: DC | PRN
  Administered 2024-01-22: 50 mL

## 2024-01-22 MED ORDER — GLYCOPYRROLATE 0.2 MG/ML IJ SOLN
INTRAMUSCULAR | Status: DC | PRN
Start: 2024-01-22 — End: 2024-01-22
  Administered 2024-01-22: .2 mg via INTRAVENOUS

## 2024-01-22 MED ORDER — ONDANSETRON HCL 4 MG/2ML IJ SOLN
4.0000 mg | Freq: Four times a day (QID) | INTRAMUSCULAR | Status: DC | PRN
  Administered 2024-01-23: 4 mg via INTRAVENOUS
  Filled 2024-01-22: qty 2

## 2024-01-22 MED ORDER — OXYCODONE HCL 5 MG/5ML PO SOLN
5.0000 mg | ORAL | Status: DC | PRN
  Administered 2024-01-22 – 2024-01-23 (×3): 5 mg via ORAL
  Filled 2024-01-22 (×4): qty 5

## 2024-01-22 MED ORDER — SODIUM CHLORIDE 0.9 % IV SOLN: INTRAVENOUS | Status: AC

## 2024-01-22 MED ORDER — ORAL CARE MOUTH RINSE: 15.0000 mL | Freq: Once | OROMUCOSAL | Status: AC

## 2024-01-22 MED ORDER — BUPIVACAINE-EPINEPHRINE (PF) 0.25% -1:200000 IJ SOLN
INTRAMUSCULAR | Status: AC
  Filled 2024-01-22: qty 30

## 2024-01-22 MED ORDER — DICYCLOMINE HCL 10 MG PO CAPS: 10.0000 mg | ORAL_CAPSULE | Freq: Three times a day (TID) | ORAL | Status: DC | PRN

## 2024-01-22 MED ORDER — GLYCOPYRROLATE 0.2 MG/ML IJ SOLN
INTRAMUSCULAR | Status: AC
  Filled 2024-01-22: qty 1

## 2024-01-22 MED ORDER — ACETAMINOPHEN 500 MG PO TABS
1000.0000 mg | ORAL_TABLET | ORAL | Status: AC
  Administered 2024-01-22: 1000 mg via ORAL

## 2024-01-22 MED ORDER — SUGAMMADEX SODIUM 200 MG/2ML IV SOLN
INTRAVENOUS | Status: DC | PRN
  Administered 2024-01-22 (×2): 200 mg via INTRAVENOUS

## 2024-01-22 MED ORDER — EPHEDRINE 5 MG/ML INJ
INTRAVENOUS | Status: AC
  Filled 2024-01-22: qty 5

## 2024-01-22 MED ORDER — DEXMEDETOMIDINE HCL IN NACL 80 MCG/20ML IV SOLN
INTRAVENOUS | Status: AC
  Filled 2024-01-22: qty 20

## 2024-01-22 MED ORDER — LACTATED RINGERS IV SOLN: INTRAVENOUS | Status: DC

## 2024-01-22 MED ORDER — CHLORHEXIDINE GLUCONATE 0.12 % MT SOLN
OROMUCOSAL | Status: AC
  Filled 2024-01-22: qty 15

## 2024-01-22 MED ORDER — PHENYLEPHRINE HCL-NACL 20-0.9 MG/250ML-% IV SOLN
INTRAVENOUS | Status: DC | PRN
  Administered 2024-01-22: 30 ug/min via INTRAVENOUS

## 2024-01-22 MED ORDER — PROPOFOL 500 MG/50ML IV EMUL
INTRAVENOUS | Status: DC | PRN
  Administered 2024-01-22: 125 ug/kg/min via INTRAVENOUS
  Administered 2024-01-22: 115 ug/kg/min via INTRAVENOUS

## 2024-01-22 MED ORDER — DIPHENHYDRAMINE HCL 50 MG/ML IJ SOLN: 12.5000 mg | Freq: Four times a day (QID) | INTRAMUSCULAR | Status: DC | PRN

## 2024-01-22 MED ORDER — GABAPENTIN 300 MG PO CAPS
ORAL_CAPSULE | ORAL | Status: AC
  Filled 2024-01-22: qty 1

## 2024-01-22 MED ORDER — MORPHINE SULFATE (PF) 2 MG/ML IV SOLN: 2.0000 mg | INTRAVENOUS | Status: DC | PRN

## 2024-01-22 MED ORDER — ENOXAPARIN SODIUM 40 MG/0.4ML IJ SOSY
40.0000 mg | PREFILLED_SYRINGE | INTRAMUSCULAR | Status: DC
  Administered 2024-01-23: 40 mg via SUBCUTANEOUS
  Filled 2024-01-22: qty 0.4

## 2024-01-22 SURGICAL SUPPLY — 44 items
APPLICATOR VISTASEAL 35 (MISCELLANEOUS) IMPLANT
CANNULA REDUCER 12-8 DVNC XI (CANNULA) ×1 IMPLANT
CLIP LIGATING HEM O LOK PURPLE (MISCELLANEOUS) IMPLANT
DERMABOND ADVANCED .7 DNX12 (GAUZE/BANDAGES/DRESSINGS) ×1 IMPLANT
DRAPE ARM DVNC X/XI (DISPOSABLE) ×4 IMPLANT
DRAPE COLUMN DVNC XI (DISPOSABLE) ×1 IMPLANT
ELECTRODE REM PT RTRN 9FT ADLT (ELECTROSURGICAL) ×1 IMPLANT
FORCEPS BPLR R/ABLATION 8 DVNC (INSTRUMENTS) ×1 IMPLANT
GLOVE BIO SURGEON STRL SZ7 (GLOVE) ×3 IMPLANT
GOWN STRL REUS W/ TWL LRG LVL3 (GOWN DISPOSABLE) ×4 IMPLANT
GRASPER LAPSCPC 5X45 DSP (INSTRUMENTS) ×1 IMPLANT
GRASPER TIP-UP FEN DVNC XI (INSTRUMENTS) ×1 IMPLANT
IRRIGATION STRYKERFLOW (MISCELLANEOUS) IMPLANT
IV NS 1000ML BAXH (IV SOLUTION) IMPLANT
KIT IMAGING PINPOINTPAQ (MISCELLANEOUS) ×1 IMPLANT
KIT PINK PAD W/HEAD ARE REST (MISCELLANEOUS) ×1 IMPLANT
KIT PINK PAD W/HEAD ARM REST (MISCELLANEOUS) ×1 IMPLANT
LABEL OR SOLS (LABEL) ×1 IMPLANT
MANIFOLD NEPTUNE II (INSTRUMENTS) ×1 IMPLANT
MESH BIO-A 7X10 SYN MAT (Mesh General) IMPLANT
NDL DRIVE SUT CUT DVNC (INSTRUMENTS) ×1 IMPLANT
NDL HYPO 22X1.5 SAFETY MO (MISCELLANEOUS) ×1 IMPLANT
NEEDLE DRIVE SUT CUT DVNC (INSTRUMENTS) ×1 IMPLANT
NEEDLE HYPO 22X1.5 SAFETY MO (MISCELLANEOUS) ×1 IMPLANT
OBTURATOR OPTICALSTD 8 DVNC (TROCAR) ×1 IMPLANT
PACK LAP CHOLECYSTECTOMY (MISCELLANEOUS) ×1 IMPLANT
SEAL UNIV 5-12 XI (MISCELLANEOUS) ×4 IMPLANT
SEALER VESSEL EXT DVNC XI (MISCELLANEOUS) ×1 IMPLANT
SOLUTION ELECTROSURG ANTI STCK (MISCELLANEOUS) ×1 IMPLANT
SPIKE FLUID TRANSFER (MISCELLANEOUS) ×1 IMPLANT
SPONGE T-LAP 18X18 ~~LOC~~+RFID (SPONGE) ×1 IMPLANT
SUT SILK 2 0 SH (SUTURE) ×2 IMPLANT
SUT STRATA 2-0 23CM CT-2 (SUTURE) ×1 IMPLANT
SUT VIC AB 3-0 SH 27X BRD (SUTURE) IMPLANT
SUT VICRYL 0 UR6 27IN ABS (SUTURE) ×2 IMPLANT
SUTURE MNCRL 4-0 27XMF (SUTURE) ×1 IMPLANT
SYR 30ML LL (SYRINGE) ×1 IMPLANT
SYRINGE TOOMEY IRRIG 70ML (MISCELLANEOUS) ×1 IMPLANT
SYSTEM BAG RETRIEVAL 10MM (BASKET) IMPLANT
TRAP FLUID SMOKE EVACUATOR (MISCELLANEOUS) ×1 IMPLANT
TRAY FOLEY SLVR 16FR LF STAT (SET/KITS/TRAYS/PACK) ×1 IMPLANT
TROCAR Z-THREAD FIOS 5X100MM (TROCAR) ×1 IMPLANT
TUBING EVAC SMOKE HEATED PNEUM (TUBING) ×1 IMPLANT
WATER STERILE IRR 500ML POUR (IV SOLUTION) ×1 IMPLANT

## 2024-01-22 NOTE — Anesthesia Procedure Notes (Cosign Needed)
 Procedure Name: Intubation Date/Time: 01/22/2024 7:37 AM  Performed by: Alphonse Asal, RNPre-anesthesia Checklist: Patient identified, Emergency Drugs available, Suction available and Patient being monitored Patient Re-evaluated:Patient Re-evaluated prior to induction Oxygen Delivery Method: Circle system utilized Preoxygenation: Pre-oxygenation with 100% oxygen Induction Type: IV induction Ventilation: Mask ventilation without difficulty Laryngoscope Size: McGrath and 3 Grade View: Grade I Tube type: Oral Tube size: 7.0 mm Number of attempts: 1 Airway Equipment and Method: Stylet and Oral airway Placement Confirmation: ETT inserted through vocal cords under direct vision, positive ETCO2 and breath sounds checked- equal and bilateral Secured at: 21 cm Tube secured with: Tape Dental Injury: Teeth and Oropharynx as per pre-operative assessment

## 2024-01-22 NOTE — Transfer of Care (Signed)
 Immediate Anesthesia Transfer of Care Note  Patient: Chelsey Price  Procedure(s) Performed: REPAIR, HERNIA, PARAESOPHAGEAL, ROBOT-ASSISTED (Abdomen)  Patient Location: PACU  Anesthesia Type:General  Level of Consciousness: drowsy and patient cooperative  Airway & Oxygen Therapy: Patient Spontanous Breathing and Patient connected to face mask oxygen  Post-op Assessment: Report given to RN, Post -op Vital signs reviewed and stable, and Patient moving all extremities  Post vital signs: Reviewed and stable  Last Vitals:  Vitals Value Taken Time  BP 107/64 01/22/24 0939  Temp    Pulse 73 01/22/24 0943  Resp 16 01/22/24 0943  SpO2 100 % 01/22/24 0943  Vitals shown include unfiled device data.  Last Pain:  Vitals:   01/22/24 0600  TempSrc: Oral  PainSc: 0-No pain         Complications: No notable events documented.

## 2024-01-22 NOTE — Anesthesia Preprocedure Evaluation (Addendum)
 Anesthesia Evaluation  Patient identified by MRN, date of birth, ID band Patient awake    Reviewed: Allergy & Precautions, NPO status , Patient's Chart, lab work & pertinent test results  History of Anesthesia Complications (+) PONV and history of anesthetic complications  Airway Mallampati: II  TM Distance: >3 FB Neck ROM: Full    Dental  (+) Partial Lower, Partial Upper   Pulmonary neg pulmonary ROS, asthma , neg sleep apnea, neg COPD, Patient abstained from smoking.Not current smoker Well controlled asthma, rare rescue inhaler use   Pulmonary exam normal breath sounds clear to auscultation       Cardiovascular Exercise Tolerance: Good (-) hypertension(-) CAD and (-) Past MI negative cardio ROS (-) dysrhythmias  Rhythm:Regular Rate:Normal - Systolic murmurs    Neuro/Psych negative neurological ROS  negative psych ROS   GI/Hepatic ,neg GERD  ,,(+)     (-) substance abuse    Endo/Other  neg diabetes    Renal/GU      Musculoskeletal   Abdominal   Peds  Hematology   Anesthesia Other Findings Past Medical History: No date: Abdominal adhesions No date: Asthma without status asthmaticus No date: Cataract of both eyes No date: High myopia No date: IBS (irritable bowel syndrome) No date: Low vision, both eyes No date: PONV (postoperative nausea and vomiting) No date: Retinal detachment  Reproductive/Obstetrics                             Anesthesia Physical Anesthesia Plan  ASA: 2  Anesthesia Plan: General ETT   Post-op Pain Management:    Induction: Intravenous  PONV Risk Score and Plan:   Airway Management Planned: Oral ETT  Additional Equipment:   Intra-op Plan:   Post-operative Plan: Extubation in OR  Informed Consent: I have reviewed the patients History and Physical, chart, labs and discussed the procedure including the risks, benefits and alternatives for the  proposed anesthesia with the patient or authorized representative who has indicated his/her understanding and acceptance.     Dental Advisory Given  Plan Discussed with: Anesthesiologist, CRNA and Surgeon  Anesthesia Plan Comments: (Patient consented for risks of anesthesia including but not limited to:  - adverse reactions to medications - damage to eyes, teeth, lips or other oral mucosa - nerve damage due to positioning  - sore throat or hoarseness - Damage to heart, brain, nerves, lungs, other parts of body or loss of life  Patient voiced understanding and assent.)       Anesthesia Quick Evaluation

## 2024-01-22 NOTE — Interval H&P Note (Signed)
 History and Physical Interval Note:  01/22/2024 7:22 AM  Chelsey Price  has presented today for surgery, with the diagnosis of Hiatal hernia.  The various methods of treatment have been discussed with the patient and family. After consideration of risks, benefits and other options for treatment, the patient has consented to  Procedure(s): REPAIR, HERNIA, PARAESOPHAGEAL, ROBOT-ASSISTED (N/A) as a surgical intervention.  The patient's history has been reviewed, patient examined, no change in status, stable for surgery.  I have reviewed the patient's chart and labs.  Questions were answered to the patient's satisfaction.     Ilay Capshaw F Wiatt Mahabir

## 2024-01-22 NOTE — Anesthesia Postprocedure Evaluation (Signed)
 Anesthesia Post Note  Patient: Chelsey Price  Procedure(s) Performed: REPAIR, HERNIA, PARAESOPHAGEAL, ROBOT-ASSISTED (Abdomen)  Patient location during evaluation: PACU Anesthesia Type: General Level of consciousness: awake and alert Pain management: pain level controlled Vital Signs Assessment: post-procedure vital signs reviewed and stable Respiratory status: spontaneous breathing, nonlabored ventilation, respiratory function stable and patient connected to nasal cannula oxygen Cardiovascular status: blood pressure returned to baseline and stable Postop Assessment: no apparent nausea or vomiting Anesthetic complications: no  No notable events documented.   Last Vitals:  Vitals:   01/22/24 1045 01/22/24 1100  BP: 114/72 115/71  Pulse: 71 72  Resp: 17 14  Temp:    SpO2: 95% 95%    Last Pain:  Vitals:   01/22/24 1100  TempSrc:   PainSc: Asleep                 Enrique Harvest

## 2024-01-22 NOTE — Op Note (Signed)
 Robotic assisted laparoscopic repair of  paraesophageal  hernia with Bio-A Mesh and Nissen fundoplication  Pre-operative Diagnosis: GERD, hiatal hernia  Post-operative Diagnosis: same  Procedure:  Robotic assisted laparoscopic repair of  paraesophageal  hernia with Bio-A Mesh and Nissen fundoplication  Surgeon: Evelia Hipp, MD FACS  Assistant: Leilani Punter. Required due to the complexity of the case the need for exposure and lack of first assist.  Anesthesia: Gen. 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 The benefits, complications, treatment options, and expected outcomes were discussed with the patient. The risks of bleeding, infection, recurrence of symptoms, failure to resolve symptoms,  esophageal damage, Dysphagia, bowel injury, any of which could require further surgery were reviewed with the patient. The likelihood of improving the patient's symptoms with return to their baseline status is good.  The patient and/or family concurred with the proposed plan, giving informed consent.  The patient was taken to Operating Room, identified  and the procedure verified.  A Time Out was held and the above information confirmed.  Prior to the induction of general anesthesia, antibiotic prophylaxis was administered. VTE prophylaxis was in place. General endotracheal anesthesia was then administered and tolerated well. After the induction, the abdomen was prepped with Chloraprep and draped in the sterile fashion. The patient was positioned in the supine position.  Cut down technique was used to enter the abdominal cavity and a Hasson trochar was placed after two vicryl stitches were anchored to the fascia. Pneumoperitoneum was  then created with CO2 and tolerated well without any adverse changes in the patient's vital signs.  Three 8-mm ports were placed under direct vision. All skin incisions  were infiltrated with a local anesthetic agent before making the incision and placing the trocars. An additional 5 mm regular laparoscopic port was placed to assist with retraction and exposure.   The patient was positioned  in reverse Trendelenburg, robot was brought to the surgical field and docked in the standard fashion.  We made sure all the instrumentation was kept indirect view at all times and that there were no collision between the arms. I scrubbed out and went to the console.  I used a robotic arm to retract the liver, the vessel sealer on my right hand and a forced bipolar grasper on my left hand.  There is along the extra 5 mm port allow me ample exposure and the ability to perform meticulous dissection  We Started dividing the lesser omentum via the pars flaccida.  We Were able to dissect the lesser curvature of the stomach and  dissected the fundus free from the right and left crus.  We circumferentially dissected the GE junction.  The left gastric artery had to be clipped and divided in the standard   fashion with a few clips to be able to mobilize the fundus of the stomach and to allow a good repair. The hernia sac was also completely reduced and we were able to bring the stomach into the intra-abdominal position.  Attention then was turned to the greater curvature where the short gastrics were divided with sealer device.  We were able to identify the left crus and again were able to make  sure there was a good circumferential dissection and that the hernia sac was completely excised.  We did perform a good dissection within the mediastinum to allow a complete reduction of the sac, gain esophageal length and a to completely allow an intra-abdominal fundoplication.  Using two strips of Bio-A as pledgets we approximated the  crus with a 2-0V Stratafix suture. A bio-A 10x7 cm mesh was inserted and secured using Vistaseal.   We Asked anesthesia to initially placed ICG via the OG, no evidence of esophageal or gastric perforation were seen, Anesthesia also placed sequential Bougies from 30 to a 50 French bougie and they went easily.  We also observe trajectory of the bougies. 320 degree Nissen fundoplication was created with multiple 2-0 silk sutures and we placed 3 stitches taking some of the esophagus within that bite.  The fundoplication measured approximately 3-1/2 cm and he was floppy. I was very happy with the way the fundoplication laid and the repair of the hernia.  Inspection of the  upper quadrant was performed. No bleeding, bile  Or esophageal injuries leaks, or bowel injuries were noted. Robotic instruments and robotic arms were undocked in the standard fashion. All the needles were removed under direct visualization.   I scrubbed back in.  Pneumoperitoneum was released.  The periumbilical port site was closed with interrumpted 0 Vicryl sutures. 4-0 subcuticular Monocryl was used to close the skin. Liposomal marcaine was injected to all the incisions sites.  Dermabond was  applied.  The patient was then extubated and brought to the recovery room in stable condition. Sponge, lap, and needle counts were correct at closure and at the conclusion of the case.               Evelia Hipp, MD, FACS

## 2024-01-23 ENCOUNTER — Encounter: Payer: Self-pay | Admitting: Surgery

## 2024-01-23 ENCOUNTER — Telehealth: Payer: Self-pay | Admitting: Surgery

## 2024-01-23 DIAGNOSIS — K449 Diaphragmatic hernia without obstruction or gangrene: Secondary | ICD-10-CM | POA: Diagnosis not present

## 2024-01-23 LAB — CBC
HCT: 33.4 % — ABNORMAL LOW (ref 36.0–46.0)
Hemoglobin: 10.6 g/dL — ABNORMAL LOW (ref 12.0–15.0)
MCH: 26 pg (ref 26.0–34.0)
MCHC: 31.7 g/dL (ref 30.0–36.0)
MCV: 82.1 fL (ref 80.0–100.0)
Platelets: 240 10*3/uL (ref 150–400)
RBC: 4.07 MIL/uL (ref 3.87–5.11)
RDW: 14.2 % (ref 11.5–15.5)
WBC: 10.6 10*3/uL — ABNORMAL HIGH (ref 4.0–10.5)
nRBC: 0 % (ref 0.0–0.2)

## 2024-01-23 LAB — BASIC METABOLIC PANEL WITH GFR
Anion gap: 9 (ref 5–15)
BUN: 20 mg/dL (ref 8–23)
CO2: 24 mmol/L (ref 22–32)
Calcium: 9.1 mg/dL (ref 8.9–10.3)
Chloride: 103 mmol/L (ref 98–111)
Creatinine, Ser: 1.1 mg/dL — ABNORMAL HIGH (ref 0.44–1.00)
GFR, Estimated: 54 mL/min — ABNORMAL LOW (ref >60)
Glucose, Bld: 123 mg/dL — ABNORMAL HIGH (ref 70–99)
Potassium: 4.4 mmol/L (ref 3.5–5.1)
Sodium: 136 mmol/L (ref 135–145)

## 2024-01-23 LAB — MAGNESIUM: Magnesium: 2 mg/dL (ref 1.7–2.4)

## 2024-01-23 LAB — PHOSPHORUS: Phosphorus: 4.6 mg/dL (ref 2.5–4.6)

## 2024-01-23 MED ORDER — OXYCODONE HCL 5 MG/5ML PO SOLN: 5.0000 mg | Freq: Four times a day (QID) | ORAL | 0 refills | Status: AC | PRN

## 2024-01-23 NOTE — Discharge Instructions (Addendum)
 In addition to included general post-operative instructions,  Diet: Recommend following Nissen diet restrictions x4 weeks minimum; hand out given regarding this.   Activity: No heavy lifting >20 pounds (children, pets, laundry, garbage) or strenuous activity for 4 weeks, but light activity and walking are encouraged. Do not drive or drink alcohol if taking narcotic pain medications or having pain that might distract from driving.  Wound care: You may shower/get incision wet with soapy water and pat dry (do not rub incisions), but no baths or submerging incision underwater until follow-up.   Medications: Resume all home medications except pantoprazole (Protonix) and famotidine (Pepcid). For mild to moderate pain: acetaminophen  (Tylenol ) or ibuprofen/naproxen (if no kidney disease). Combining Tylenol  with alcohol can substantially increase your risk of causing liver disease. Narcotic pain medications, if prescribed, can be used for severe pain, though may cause nausea, constipation, and drowsiness. Do not combine Tylenol  and Percocet (or similar) within a 6 hour period as Percocet (and similar) contain(s) Tylenol . If you do not need the narcotic pain medication, you do not need to fill the prescription.  Call office [phone] / [phone]) at any time if any questions, worsening pain, fevers/chills, bleeding, drainage from incision site, or other concerns.

## 2024-01-23 NOTE — Telephone Encounter (Signed)
 Patient just now calls with a medication problem with her Oxycodone.  She had paraesophageal hernia repair done 01/22/24 with Dr. Dana Duncan.  Patient states that her pharmacy told her that her insurance will not pay for the Oxy.  Insurance is requiring reason why this is being prescribed.  Patient states that pharmacy suggested an alternative to pain med.  Patient uses [facility] on [address] in [place].  Please call patient.

## 2024-01-23 NOTE — Progress Notes (Signed)
 DISCHARGE NOTE:  Pt given discharge instruction and verbalized understanding. Pt wheeled to car by staff, family providing transportation home.

## 2024-01-23 NOTE — Plan of Care (Signed)
   Problem: Elimination: Goal: Will not experience complications related to urinary retention Outcome: Progressing   Problem: Pain Managment: Goal: General experience of comfort will improve and/or be controlled Outcome: Progressing   Problem: Safety: Goal: Ability to remain free from injury will improve Outcome: Progressing

## 2024-01-23 NOTE — Care Management Obs Status (Signed)
 MEDICARE OBSERVATION STATUS NOTIFICATION   Patient Details  Name: Chelsey Price MRN: [id number] Date of Birth: 20-Jun-1956   Medicare Observation Status Notification Given:       [name] 01/23/2024, 10:56 AM

## 2024-01-23 NOTE — Discharge Summary (Signed)
 [facility] SURGICAL DISCHARGE SUMMARY  Patient ID: Chelsey Price MRN: [id number] DOB/AGE: 09-22-1955 68 y.o.  Admit date: 01/22/2024 Discharge date: 01/23/2024  Discharge Diagnoses Patient Active Problem List   Diagnosis Date Noted   Hiatal hernia 01/22/2024   S/P repair of paraesophageal hernia 01/22/2024    Consultants None  Procedures 01/22/2024:  Robotic Assisted Laparoscopic Paraesophageal Hernia Repair  Nissen Fundoplication   HPI: Chelsey Price is a 68 y.o. female with history of hiatal hernia who presents to [facility] on 05/27 for scheduled repair with Dr [facility] Course: Informed consent was obtained and documented, and patient underwent uneventful robotic assisted laparoscopic paraesophageal hernia repair and Nissen fundoplication (Dr Dana Duncan, 01/22/2024).  Post-operatively, patient did well. Advancement of patient's diet and ambulation were well-tolerated. The remainder of patient's hospital course was essentially unremarkable, and discharge planning was initiated accordingly with patient safely able to be discharged home with appropriate discharge instructions, pain control, and outpatient follow-up after all of her questions were answered to her expressed satisfaction.   Discharge Condition: Good   Physical Examination:  Constitutional: Well appearing female, NAD Pulmonary: Normal effort, no respiratory distress Gastrointestinal: Soft, incisional soreness expectedly, non-distended, no rebound/guarding Skin: Laparoscopic incisions are CDI with dermabond, no erythema   Allergies as of 01/23/2024       Reactions   Iodine Anaphylaxis, Hives   Shellfish Allergy Anaphylaxis, Hives   Shrimp and Crabs   Crestor [rosuvastatin]    Cramps    Sulfa Antibiotics Rash   Tomato Rash        Medication List     STOP taking these medications    famotidine 20 MG tablet Commonly known as: PEPCID   pantoprazole 40 MG tablet Commonly known  as: PROTONIX       TAKE these medications    albuterol  108 (90 Base) MCG/ACT inhaler Commonly known as: VENTOLIN  HFA Inhale 2 puffs into the lungs every 4 (four) hours as needed for wheezing or shortness of breath.   azelastine 0.1 % nasal spray Commonly known as: ASTELIN Place 1 spray into the nose 2 (two) times daily as needed for rhinitis or allergies.   betamethasone dipropionate 0.05 % lotion Apply 1 Application topically daily as needed (irritation).   Biotin 1000 MCG Chew Chew 1,000 mcg by mouth daily.   cholecalciferol 25 MCG (1000 UNIT) tablet Commonly known as: VITAMIN D3 Take 1,000 Units by mouth 3 (three) times a week.   cyanocobalamin 1000 MCG/ML injection Commonly known as: VITAMIN B12 Inject 1,000 mcg into the muscle every 30 (thirty) days.   dicyclomine 10 MG capsule Commonly known as: BENTYL Take 10 mg by mouth 3 (three) times daily as needed for spasms.   fluticasone 50 MCG/ACT nasal spray Commonly known as: FLONASE Place 2 sprays into both nostrils daily as needed for allergies.   Fluticasone-Salmeterol 100-50 MCG/DOSE Aepb Commonly known as: ADVAIR Inhale 1 puff into the lungs 2 (two) times daily.   InnoSpire Elegance Nebulizer Misc See admin instructions.   montelukast 10 MG tablet Commonly known as: SINGULAIR Take 10 mg by mouth at bedtime.   oxyCODONE 5 MG/5ML solution Commonly known as: ROXICODONE Take 5 mLs (5 mg total) by mouth every 6 (six) hours as needed for up to 10 days for moderate pain (pain score 4-6) or severe pain (pain score 7-10).   polyethylene glycol 17 g packet Commonly known as: MIRALAX / GLYCOLAX Take 17 g by mouth daily as needed for moderate constipation. Take 17 g by  mouth once daily as needed. Mix in 4-8ounces of fluid prior to taking. Pt reports she for IBS flare          Follow-up Information     Alben Alma, MD. Go on 02/06/2024.   Specialty: General Surgery Why: Go to appointment on 06/11 at 245  PM Contact information: [address] [place] [address] [phone]                  Time spent on discharge management including discussion of hospital course, clinical condition, outpatient instructions, prescriptions, and follow up with the patient and members of the medical team: >30 minutes  -- Apolonio Bay , PA-C [facility]  01/23/2024, 8:36 AM [phone] M-F: 7am - 4pm

## 2024-01-23 NOTE — Care Management Obs Status (Signed)
 MEDICARE OBSERVATION STATUS NOTIFICATION   Patient Details  Name: Chelsey Price MRN: [id number] Date of Birth: Feb 27, 1956   Medicare Observation Status Notification Given:  Yes    [name] 01/23/2024, 10:57 AM

## 2024-02-06 ENCOUNTER — Ambulatory Visit (INDEPENDENT_AMBULATORY_CARE_PROVIDER_SITE_OTHER): Admitting: Surgery

## 2024-02-06 ENCOUNTER — Encounter: Payer: Self-pay | Admitting: Surgery

## 2024-02-06 VITALS — BP 105/71 | HR 79 | Temp 98.1°F | Ht 63.0 in | Wt 158.0 lb

## 2024-02-06 DIAGNOSIS — Z09 Encounter for follow-up examination after completed treatment for conditions other than malignant neoplasm: Secondary | ICD-10-CM

## 2024-02-06 DIAGNOSIS — K449 Diaphragmatic hernia without obstruction or gangrene: Secondary | ICD-10-CM

## 2024-02-06 NOTE — Progress Notes (Signed)
 Surgical Consultation  02/06/2024  Chelsey Price is an 68 y.o. female.   Chief Complaint  Patient presents with   Routine Post Op    Paraesophageal Hernia      HPI: Chelsey Price is 2 weeks out from paraesophageal hernia repair.  She is doing okay.  She stated that she has no nausea and this has improved.  Initially had some difficulty swallowing but now it is getting better.  This is not unexpected.  She is otherwise doing well she is walking she is driving, there is minimal abdominal discomfort  Past Medical History:  Diagnosis Date   Abdominal adhesions    Anemia    Asthma without status asthmaticus    B12 deficiency    Cataract of both eyes    High myopia    IBS (irritable bowel syndrome)    Low vision, both eyes    Mixed hyperlipidemia    Pneumonia    PONV (postoperative nausea and vomiting)    Retinal detachment     Past Surgical History:  Procedure Laterality Date   BIOPSY  09/19/2023   Procedure: BIOPSY;  Surgeon: Corky Diener, Alphonsus Jeans, MD;  Location: [facility] ENDOSCOPY;  Service: Gastroenterology;;   CATARACT EXTRACTION W/ INTRAOCULAR LENS IMPLANT Right 2017   COLONOSCOPY WITH PROPOFOL  N/A 04/16/2018   Procedure: COLONOSCOPY WITH PROPOFOL ;  Surgeon: Deveron Fly, MD;  Location: [facility] ENDOSCOPY;  Service: Endoscopy;  Laterality: N/A;   COLONOSCOPY WITH PROPOFOL  N/A 09/19/2023   Procedure: COLONOSCOPY WITH PROPOFOL ;  Surgeon: Toledo, Alphonsus Jeans, MD;  Location: [facility] ENDOSCOPY;  Service: Gastroenterology;  Laterality: N/A;   ESOPHAGOGASTRODUODENOSCOPY (EGD) WITH PROPOFOL  N/A 04/16/2018   Procedure: ESOPHAGOGASTRODUODENOSCOPY (EGD) WITH PROPOFOL ;  Surgeon: Deveron Fly, MD;  Location: [facility] ENDOSCOPY;  Service: Endoscopy;  Laterality: N/A;   ESOPHAGOGASTRODUODENOSCOPY (EGD) WITH PROPOFOL  N/A 09/19/2023   Procedure: ESOPHAGOGASTRODUODENOSCOPY (EGD) WITH PROPOFOL ;  Surgeon: Toledo, Alphonsus Jeans, MD;  Location: [facility] ENDOSCOPY;  Service: Gastroenterology;  Laterality: N/A;    KNEE ARTHROSCOPY Right    OOPHORECTOMY Right 1992   OOPHORECTOMY Left 1994   PAROTIDECTOMY     POLYPECTOMY  09/19/2023   Procedure: POLYPECTOMY;  Surgeon: Corky Diener, Alphonsus Jeans, MD;  Location: [facility] ENDOSCOPY;  Service: Gastroenterology;;   RETINAL DETACHMENT SURGERY Right 2017   RETINAL DETACHMENT SURGERY Left 06/1983   RETINAL DETACHMENT SURGERY Right 08/1983   VAGINAL HYSTERECTOMY  1990   XI ROBOTIC ASSISTED PARAESOPHAGEAL HERNIA REPAIR N/A 01/22/2024   Procedure: REPAIR, HERNIA, PARAESOPHAGEAL, ROBOT-ASSISTED;  Surgeon: Alben Alma, MD;  Location: [facility] ORS;  Service: General;  Laterality: N/A;    Family History  Problem Relation Age of Onset   Heart disease Father    Dementia Father    Diabetes Sister    Lymphoma Maternal Uncle    Breast cancer Maternal Grandmother    Thyroid  disease Sister    Leukemia Cousin     Social History:  reports that she has never smoked. She has never been exposed to tobacco smoke. She has never used smokeless tobacco. She reports that she does not currently use alcohol. She reports that she does not currently use drugs.  Allergies:  Allergies  Allergen Reactions   Iodine Anaphylaxis and Hives   Shellfish Allergy Anaphylaxis and Hives    Shrimp and Crabs   Crestor [Rosuvastatin]     Cramps    Sulfa Antibiotics Rash   Tomato Rash    Medications reviewed.     ROS Full ROS performed and is otherwise negative other than what is  stated in the HPI    BP 105/71   Pulse 79   Temp 98.1 F (36.7 C) (Oral)   Ht 5' 3 (1.6 m)   Wt 158 lb (71.7 kg)   SpO2 99%   BMI 27.99 kg/m   Physical Exam NAD alert ABd: soft, incisions c/d/I, no infection or peritonitis.  Assessment/Plan: Overall doing well from paraesophageal hernia repair. May continue soft diet for now. Reassure her about benign findings.  We typically start seeing less dysphagia as time progresses due to edema.  Clinical picture seems consistent with this. RTC in 3 to 4  weeks  Evelia Hipp, MD [facility] General Surgeon

## 2024-02-06 NOTE — Patient Instructions (Signed)
 Please call the office if you have any questions or concerns    Laparoscopic Nissen Fundoplication, Care After Refer to this sheet in the next few weeks. These instructions provide you with information about caring for yourself after your procedure. Your health care provider may also give you more specific instructions. Your treatment has been planned according to current medical practices, but problems sometimes occur. Call your health care provider if you have any problems or questions after your procedure. What can I expect after the procedure? After the procedure, it is common to have: Difficulty swallowing (dysphagia). Excess gas (bloating). Follow these instructions at home: Medicines  Take medicines only as directed by your health care provider. Do not drive or operate heavy machinery while taking pain medicine. Incision care  There are many different ways to close and cover an incision, including stitches (sutures), skin glue, and adhesive strips. Follow your health care provider's instructions about: Incision care. Bandage (dressing) changes and removal. Incision closure removal. Check your incision areas every day for signs of infection. Watch for: Redness, swelling, or pain. Fluid, blood, or pus. Do not take baths, swim, or use a hot tub until your health care provider approves. Take showers as directed by your health care provider. Eating and drinking  Follow your health care provider's instructions about eating. You may need to follow a very soft diet for 2 weeks, followed by a diet of more regular foods for 2 weeks, no breads. You should return to your usual diet gradually. Drink enough fluid to keep your urine clear or pale yellow. Activity  Return to your normal activities as directed by your health care provider. Ask your health care provider what activities are safe for you. Avoid strenuous exercise. Do not lift anything that is heavier than 10 lb (4.5 kg). Ask your  health care provider when you can: Return to sexual activity. Drive. Go back to work. Contact a health care provider if: You have a fever. Your pain gets worse or is not helped by medicine. You have frequent nausea or vomiting. You have continued abdominal bloating. You have an ongoing (persistent) cough. You have redness, swelling, or pain in any incision areas. You have fluid, blood, or pus coming from any incisions. Get help right away if: You have trouble breathing. You are unable to swallow. You have persistent vomiting. You have blood in your vomit. You have severe abdominal pain. This information is not intended to replace advice given to you by your health care provider. Make sure you discuss any questions you have with your health care provider. Document Released: 04/06/2004 Document Revised: 01/20/2016 Document Reviewed: 04/15/2014 Elsevier Interactive Patient Education  2017 [employer]   Diet After Nissen Fundoplication Surgery This diet information is for patients who have recently had Nissen fundoplication surgery to correct reflux disease or to repair various types of hernias, such as hiatal hernia and intrathoracic stomach. This diet may also be used for other gastrointestinal surgeries, such as Heller myotomy and repair of achalasia. The diet will help control diarrhea, excess gas and swallowing problems, which may occur after this type of surgery. Keeping Your Stomach from Stretching Eat small, frequent meals (six to eight per day). This will help you consume the majority of the nutrients you need without causing your stomach to feel full or distended.  Drinking large amounts of fluids with meals can stretch your stomach. You may drink fluids between meals as often as you like, but limit fluids to 1/2 cup (4  fluid ounces) with meals and one cup (8 fluid ounces) with snacks.  Sit upright while eating and stay upright for 30 minutes after each meal. Gravity can help  food move through your digestive tract. Do not lie down after eating. Sit upright for 2 hours after your last meal or snack of the day.  Eat very slowly. Take your time when eating.  Take small bites and chew your food well to help aid in swallowing and digestion.  Avoid crusty breads and sticky, gummy foods, such as bananas, fresh doughy breads, rolls and doughnuts. These types of foods become sticky and difficult to swallow.  Toasted breads tend to be better tolerated.  Lastly, if you eat sweets, consume them at the end of your meal to avoid a group of symptoms referred to as "dumping syndrome". This describes the rapid emptying of foods from the stomach to the small intestine. Sweetened beverages, candy and desserts move more rapidly and dump quickly into the intestines. This can cause symptoms of nausea, weakness, cold sweats, cramps, diarrhea and dizzy spells.  Avoiding Gas Avoid drinking through a straw. Do not chew gum or tobacco. These actions cause you to swallow air, which produces excess gas in your stomach. Chew with your mouth closed.  Avoid any foods that cause stomach gas and distention. These foods include corn, dried beans, peas, lentils, onions, broccoli, cauliflower and any food from the cabbage family.  Avoid carbonated drinks, alcohol, citrus and tomato products.  When will I be able to eat a soft diet? After Nissen fundoplication surgery, your diet will be advanced slowly by your surgeon. Generally, you will be on a clear liquid diet for the first few meals. Then you will advance to the full liquid diet for a meal or two and eventually to a Nissen soft diet. Please be aware that each patient's tolerance to food is different. Your doctor will advance your diet depending on how well you progress after surgery. Clear Liquid Diet  The first diet after surgery is the clear liquid diet. It includes the following liquids: Apple juice  Cranberry juice  Grape juice  Chicken broth   Beef broth  Flavored gelatin (Jell-O)  Decaf tea and coffee  Caffeinated beverages are permitted based on tolerance  Popsicles  [place] ice Carbonated drinks (sodas) are not allowed for the first six to eight weeks after surgery. After this time you can try them again in small amounts.  Full Liquid Diet The full liquid diet contains anything on the clear liquid diet, plus: Milk, soy, rice and almond (no chocolate)  Cream of wheat, cream of rice, grits  Strained creamed soups (no tomato or broccoli)  Vanilla and strawberry-flavored ice cream  Sherbet  Blended, custard styled or whipped yogurt (plain or vanilla only)  Vanilla and butterscotch pudding (no chocolate or coconut)  Nutritional drinks including Ensure, Boost, Carnation Instant Breakfast (no chocolate-flavored) Note: Dairy products, such as milk, ice cream and pudding, may cause diarrhea in some people just after surgery. You may need to avoid milk products. If so, substitute them with lactose-free beverages, such as soy, rice, Lactaid or almond milks.  Nissen Soft Diet Food Category Foods to Choose Foods to Avoid  Beverages Milk, such as, whole, 2%, 1%, non-fat, or skim, soy, rice, almond  Caffeinated and decaf tea and coffee  Powdered drink mixes (in moderation)  Non-citrus juices (apple, grape, cranberry or blends of these)  Fruit nectars  Nutritional drinks including Boost, Ensure, Carnation Instant Breakfast Chocolate  milk, cocoa or other chocolate-flavored drinks  Carbonated drinks  Alcohol  Citrus juices like orange, grapefruit, lemon and lime  Breads Crackers (saltine, butter, soda, graham, Goldfish and Cheese Nips)   Untoasted bread, bagels, [place] and hard rolls, English muffins  Crackers with nuts, seeds, fresh or dried fruit, coconut, or highly seasoned, such as garlic or onion-flavored  Sweet rolls, coffee cake or doughnuts  Cereals Well cooked cereals, such as oatmeal (plain or flavored)  Cold  cereal (Cornflakes, Rice Krispies, Cheerios, Special K plain, Rice Chex and puffed rice) Very coarse cereal, such as bran, shredded wheat  Any cereal with fresh or dried fruit, coconut, seeds or nuts  Desserts Eat in moderation and do not eat desserts or sweets by themselves. Plain cakes, cookies and cream-filled pies  Vanilla and butterscotch pudding or custard  Ice cream, ice milk, frozen yogurt and sherbet  Gelatin made from allowed foods  Fruit ices and popsicles Desserts containing chocolate, coconut, nuts, seeds, fresh or dried fruit, peppermint or spearmint  Eggs  Poached, hard boiled or scrambled Fried eggs and highly seasoned eggs (deviled eggs)  Fats Eat in moderation. Butter and margarine  Mayonnaise and vegetable oils  Mildly seasoned cream sauces and gravies  Plain cream cheese  Sour cream Highly seasoned salad dressings, cream sauces and gravies  Bacon, bacon fat, ham fat, lard and salt pork  Fried foods  Nuts  Fruits Fruit juice  Any canned or cooked fruit except those listed in the AVOID column ALL fresh fruits, such as citrus, apples, and pineapple  Canned pineapple  Dried fruits, such as raisins, berries  Fruits with seeds, such as berries, kiwi and figs  Meat, Fish, Poultry, and Dairy Products Meats may be ground, minced or chopped to ease swallowing and digestion  Tender, well cooked and moist cuts of beef, chicken, [place] and pork  Veal and lamb  Flaky, cooked fish  Canned tuna  Cottage and ricotta cheeses  Mild cheese, such as American, brick, mozzarella and baby Swiss  Creamy peanut butter  Plain custard or blended fruit yogurt  Moist casseroles, such as macaroni & cheese, tuna noodle  Grilled or toasted cheese sandwich Tough meats with a lot of gristle  Fried, highly seasoned, smoked and fatty meat, fish or poultry, such as frankfurters, luncheon meats, sausage, bacon, spare ribs, beef brisket, sardines, anchovies, duck and goose  Chili and other  entrees made with pepper or chili pepper  Shellfish  Strongly flavored cheeses, such as sharp cheese, extra sharp cheddar, cheese containing peppers or other seasonings  Crunchy peanut butter  Any yogurt with nuts, seeds, coconut, strawberries or raspberries  Potatoes and Starches Peeled, mashed or boiled white or sweet potatoes  Oven-baked potatoes without skin  Well cooked white rice, enriched noodles, barley, spaghetti, macaroni and other pastas Fried potatoes, potato skins and potato chips  Hard and soft taco shells  Fried, brown or wild rice  Soups Mildly flavored meat stocks  Cream soups made from allowed foods Highly seasoned soups and tomato based soups, cream soups made with gas producing vegetables, such as broccoli, cauliflower, onion, etc.  Sweets and Snacks Use in moderation and do not eat large amounts of sweets by themselves. Syrup, honey, jelly and seedless jam  Plain hard candies and plain candies made with allowed ingredients  Molasses  Marshmallows  Other candy made from allowed ingredients  Thin pretzels Jam, marmalade and preserves  Chocolate in any form  Any candy containing nuts, coconut, seeds, peppermint, spearmint or  dried or fresh fruit  Popcorn, potato chips, tortilla chips  Soft or hard thick pretzels, such as sourdough  Vegetables Well cooked soft vegetables without seeds or skins, such as asparagus tips, beets, carrots, green and wax beans, chopped spinach, tender canned baby peas, squash and pumpkin Raw vegetables, tomatoes, tomato juice, tomato sauce and V-8 juice(tomato based products can irritate the stomach)  Gas producing vegetables, such as broccoli, Brussel sprouts, cabbage, cauliflower, onions, corn, cucumber, green peppers, rutabagas, turnips, radishes and sauerkraut  Dried beans, peas and lentils  Miscellaneous Salt and spices in moderation  Mustard and vinegar in moderation Fried or highly seasoned foods  Coconut and seeds  Pickles and olives   Chili sauces, ketchup, barbecue sauce, horseradish, black pepper, chili powder and onion and garlic seasonings  Any other strongly flavored seasoning, condiment, spice or herb not tolerated  Any food not tolerated

## 2024-03-05 ENCOUNTER — Encounter: Payer: Self-pay | Admitting: Surgery

## 2024-03-05 ENCOUNTER — Ambulatory Visit (INDEPENDENT_AMBULATORY_CARE_PROVIDER_SITE_OTHER): Admitting: Surgery

## 2024-03-05 VITALS — BP 125/71 | HR 82 | Temp 98.1°F | Ht 63.0 in | Wt 154.2 lb

## 2024-03-05 DIAGNOSIS — K449 Diaphragmatic hernia without obstruction or gangrene: Secondary | ICD-10-CM

## 2024-03-05 DIAGNOSIS — Z09 Encounter for follow-up examination after completed treatment for conditions other than malignant neoplasm: Secondary | ICD-10-CM

## 2024-03-05 NOTE — Progress Notes (Signed)
 Outpatient Surgical Follow Up  03/05/2024  Chelsey Price is an 68 y.o. female.   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 Overall doing well. She did have some mild dysphagia for certain meals. She is ok. Ambulating + BM, tolerating diet Bras made her chest tight  Past Medical History:  Diagnosis Date   Abdominal adhesions    Anemia    Asthma without status asthmaticus    B12 deficiency    Cataract of both eyes    High myopia    IBS (irritable bowel syndrome)    Low vision, both eyes    Mixed hyperlipidemia    Pneumonia    PONV (postoperative nausea and vomiting)    Retinal detachment     Past Surgical History:  Procedure Laterality Date   BIOPSY  09/19/2023   Procedure: BIOPSY;  Surgeon: Aundria, Ladell POUR, MD;  Location: [facility] ENDOSCOPY;  Service: Gastroenterology;;   CATARACT EXTRACTION W/ INTRAOCULAR LENS IMPLANT Right 2017   COLONOSCOPY WITH PROPOFOL  N/A 04/16/2018   Procedure: COLONOSCOPY WITH PROPOFOL ;  Surgeon: Gaylyn Gladis PENNER, MD;  Location: [facility] ENDOSCOPY;  Service: Endoscopy;  Laterality: N/A;   COLONOSCOPY WITH PROPOFOL  N/A 09/19/2023   Procedure: COLONOSCOPY WITH PROPOFOL ;  Surgeon: Toledo, Ladell POUR, MD;  Location: [facility] ENDOSCOPY;  Service: Gastroenterology;  Laterality: N/A;   ESOPHAGOGASTRODUODENOSCOPY (EGD) WITH PROPOFOL  N/A 04/16/2018   Procedure: ESOPHAGOGASTRODUODENOSCOPY (EGD) WITH PROPOFOL ;  Surgeon: Gaylyn Gladis PENNER, MD;  Location: [facility] ENDOSCOPY;  Service: Endoscopy;  Laterality: N/A;   ESOPHAGOGASTRODUODENOSCOPY (EGD) WITH PROPOFOL  N/A 09/19/2023   Procedure: ESOPHAGOGASTRODUODENOSCOPY (EGD) WITH PROPOFOL ;  Surgeon: Toledo, Ladell POUR, MD;  Location: [facility] ENDOSCOPY;  Service: Gastroenterology;  Laterality: N/A;   KNEE ARTHROSCOPY Right    OOPHORECTOMY Right 1992   OOPHORECTOMY Left 1994   PAROTIDECTOMY     POLYPECTOMY   09/19/2023   Procedure: POLYPECTOMY;  Surgeon: Aundria, Ladell POUR, MD;  Location: [facility] ENDOSCOPY;  Service: Gastroenterology;;   RETINAL DETACHMENT SURGERY Right 2017   RETINAL DETACHMENT SURGERY Left 06/1983   RETINAL DETACHMENT SURGERY Right 08/1983   VAGINAL HYSTERECTOMY  1990   XI ROBOTIC ASSISTED PARAESOPHAGEAL HERNIA REPAIR N/A 01/22/2024   Procedure: REPAIR, HERNIA, PARAESOPHAGEAL, ROBOT-ASSISTED;  Surgeon: Jordis Laneta FALCON, MD;  Location: [facility] ORS;  Service: General;  Laterality: N/A;    Family History  Problem Relation Age of Onset   Heart disease Father    Dementia Father    Diabetes Sister    Lymphoma Maternal Uncle    Breast cancer Maternal Grandmother    Thyroid  disease Sister    Leukemia Cousin     Social History:  reports that she has never smoked. She has never been exposed to tobacco smoke. She has never used smokeless tobacco. She reports that she does not currently use alcohol. She reports that she does not currently use drugs.  Allergies:  Allergies  Allergen Reactions   Iodine Anaphylaxis and Hives   Shellfish Allergy Anaphylaxis and Hives    Shrimp and Crabs   Crestor [Rosuvastatin]     Cramps    Sulfa Antibiotics Rash   Tomato Rash    Medications reviewed.    ROS Full ROS performed and is otherwise negative other than what is stated in HPI   BP 125/71   Pulse 82   Temp 98.1 F (36.7 C) (Oral)   Ht 5' 3 (1.6 m)  Wt 154 lb 3.2 oz (69.9 kg)   SpO2 98%   BMI 27.32 kg/m   Physical Exam NAD alert Jai:dnqu, nt, incisions healed, no rebound   Assessment/Plan: Doing well May advacne diet No evidence of complications RTC 6-8 weeks  Laneta Luna, MD [facility] General Surgeon

## 2024-03-05 NOTE — Patient Instructions (Signed)
 Surgery to Stop Reflux (Laparoscopic Nissen Fundoplication): What to Know After After having a surgery called laparoscopic Nissen fundoplication, it's common to have pain when you swallow. You may also have: Trouble swallowing. Soreness in your belly or chest. Bloating. Follow these instructions at home: Medicines Take your medicines only as told. Ask your health care provider if you can crush any pill that you need to take. Take only liquid medicines as told. Ask your provider if it's safe to drive or use machines while taking your medicine. Eating and drinking Eat and drink as told. You may need to: Only have liquids for 2 weeks. After that, you may need to only have soft foods for 2 weeks. Eat slowly. Take small bites. Chew your food well. Eat or drink only while upright. Go back to your normal diet slowly. Drink more fluids as told. Caring for your cuts from surgery  Take care of your cuts from surgery as told. Make sure you: Wash your hands with soap and water for at least 20 seconds before and after you change your bandage. If you can't use soap and water, use hand sanitizer. Change your bandage. Leave stitches or skin glue alone. Leave tape strips alone unless you're told to take them off. You may trim the edges of the tape strips if they curl up. Check the area around your cuts every day for signs of infection. Check for: More redness, swelling, or pain. More fluid or blood. Warmth. Pus or a bad smell. Activity If you were given a sedative, do not drive or use machines until you're told it's safe. A sedative can make you sleepy. Rest as told. Get up to take short walks at least every 2 hours many times during the day. This helps you breathe better and keeps your blood flowing. Ask for help if you feel weak or unsteady. Do not lift anything heavier than 10 lb (4.5 kg) until you're told it's OK. This may be after about 6 weeks. Try not to do things that take a lot of  effort. Ask when you can have sex again. Ask what things are safe for you to do at home. Ask when you can go back to work or school. General instructions Do not take baths, swim, or use a hot tub until you're told it's OK. Ask if you can shower. Do not smoke, vape, or use nicotine or tobacco. Doing this can slow down healing. Your provider may give you more instructions. Make sure you know what you can and can't do. Contact a health care provider if: You have chills or a fever. You have more trouble swallowing. You have painful bloating. You have heartburn that won't go away. Your pain doesn't get better with medicine. You throw up a lot or often feel like you may throw up. A cut from surgery opens up. You have any signs of infection. Get help right away if: You have signs of a perforation. These include: Very bad pain. Throwing up and not being able to stop. A fever. A heart beat that's too fast. You have very bad pain or bloating. You keep throwing up and can't stop. You have blood in your throw up. You have chest pain or trouble breathing. These symptoms may be an emergency. Call 911 right away. Do not wait to see if the symptoms will go away. Do not drive yourself to the hospital. This information is not intended to replace advice given to you by your health care provider. Make  sure you discuss any questions you have with your health care provider. Document Revised: 04/05/2023 Document Reviewed: 04/05/2023 Elsevier Patient Education  2024 [employer].

## 2024-05-07 ENCOUNTER — Ambulatory Visit (INDEPENDENT_AMBULATORY_CARE_PROVIDER_SITE_OTHER): Admitting: Surgery

## 2024-05-07 ENCOUNTER — Encounter: Payer: Self-pay | Admitting: Surgery

## 2024-05-07 VITALS — BP 132/76 | HR 73 | Temp 98.2°F | Ht 63.0 in | Wt 152.0 lb

## 2024-05-07 DIAGNOSIS — Z09 Encounter for follow-up examination after completed treatment for conditions other than malignant neoplasm: Secondary | ICD-10-CM

## 2024-05-07 DIAGNOSIS — K30 Functional dyspepsia: Secondary | ICD-10-CM

## 2024-05-07 DIAGNOSIS — K449 Diaphragmatic hernia without obstruction or gangrene: Secondary | ICD-10-CM | POA: Diagnosis not present

## 2024-05-07 NOTE — Patient Instructions (Addendum)
 Your Ultrasound is scheduled for 05/12/2024 9:30 am (arrive by 9 am) at [facility]. Nothing to eat or drink after midnight.    Gallbladder Problems: Eating Plan Having high blood cholesterol, obesity, an inactive lifestyle, an unhealthy diet, or diabetes can put you at risk for getting gallstones. If you have a gallbladder problem, you may have trouble digesting fats. You may also have symptoms when you eat a lot of fat. Eating a low-fat diet can help lessen your symptoms. It may be helpful before and after having surgery to have your gallbladder taken out. Your health care provider may recommend that you work with an expert in healthy eating called a dietitian. They can help you lower the amount of fat in your diet. What are tips for following this plan? General guidelines Limit how much fat you have to less than 30% of your total daily calories. If you eat around 1,800 calories each day, you'll be eating less than 60 grams (g) of fat a day. Fat is an important part of a healthy diet. Eating a low-fat diet can make it hard to keep a healthy body weight. Ask your dietitian how much fat, calories, and other nutrients you need each day. Eat small meals often throughout the day instead of 3 large meals. Drink at least 8-10 cups (1.9-2.4 L) of fluid a day. If you drink alcohol: Limit how much you have to: 0-1 drink a day if you're female. 0-2 drinks a day if you're female. Know how much alcohol is in your drink. In the [place] one drink is one 12 oz bottle of beer (355 mL), one 5 oz glass of wine (148 mL), or one 1 oz glass of hard liquor (44 mL). Reading food labels  Check nutrition facts on food labels for how much fat is in a serving. Choose foods with less than 3 grams of fat per serving. Shopping Choose nonfat and low-fat healthy foods. Look for the words nonfat, low-fat, or fat-free. Avoid buying processed or prepackaged foods. Cooking Cook using low-fat methods, such as baking,  broiling, grilling, or boiling. Cook with small amounts of healthy fats, such as olive oil, grapeseed oil, canola oil, avocado oil, or sunflower oil. What foods are recommended?  All fresh, frozen, or canned fruits and vegetables. Whole grains. Low-fat or nonfat (skim) milk and yogurt. Lean meat, skinless poultry, fish, eggs, and beans. Low-fat protein supplement powders or drinks. Spices and herbs. The items listed above may not be all the foods and drinks you can have. Talk with a dietitian to learn more. What foods are not recommended? High-fat foods. These include baked goods, fast food, fatty cuts of meat, ice cream, french toast, sweet rolls, pizza, cheese bread, foods covered with butter, creamy sauces, or cheese. Fried foods. These include french fries, tempura, battered fish, breaded chicken, fried breads, and sweets. Foods that cause bloating and gas. The items listed above may not be all the foods and drinks you should avoid. Talk with a dietitian to learn more. This information is not intended to replace advice given to you by your health care provider. Make sure you discuss any questions you have with your health care provider. Document Revised: 11/20/2023 Document Reviewed: 11/20/2023 Elsevier Patient Education  2025 [employer].

## 2024-05-08 NOTE — Progress Notes (Signed)
 Outpatient Surgical Follow Up    Chelsey Price is an 68 y.o. female.   Chief Complaint  Patient presents with   Follow-up    Paraesophageal hernia    HPI: Findings 3-1/2 months after paraesophageal hernia repair.  She endorses doing well and she is very happy with results.  Endorses no reflux.  No swallowing issues.  Her nausea has significantly improved after the surgery.  She does endorse some burning sensation in epigastric area that is intermittent without any specific alleviating or aggravating factors.  No fevers no chills.  He does feel full quicker  Past Medical History:  Diagnosis Date   Abdominal adhesions    Anemia    Asthma without status asthmaticus    B12 deficiency    Cataract of both eyes    High myopia    IBS (irritable bowel syndrome)    Low vision, both eyes    Mixed hyperlipidemia    Pneumonia    PONV (postoperative nausea and vomiting)    Retinal detachment     Past Surgical History:  Procedure Laterality Date   BIOPSY  09/19/2023   Procedure: BIOPSY;  Surgeon: Aundria, Ladell POUR, MD;  Location: [facility] ENDOSCOPY;  Service: Gastroenterology;;   CATARACT EXTRACTION W/ INTRAOCULAR LENS IMPLANT Right 2017   COLONOSCOPY WITH PROPOFOL  N/A 04/16/2018   Procedure: COLONOSCOPY WITH PROPOFOL ;  Surgeon: Gaylyn Gladis PENNER, MD;  Location: [facility] ENDOSCOPY;  Service: Endoscopy;  Laterality: N/A;   COLONOSCOPY WITH PROPOFOL  N/A 09/19/2023   Procedure: COLONOSCOPY WITH PROPOFOL ;  Surgeon: Toledo, Ladell POUR, MD;  Location: [facility] ENDOSCOPY;  Service: Gastroenterology;  Laterality: N/A;   ESOPHAGOGASTRODUODENOSCOPY (EGD) WITH PROPOFOL  N/A 04/16/2018   Procedure: ESOPHAGOGASTRODUODENOSCOPY (EGD) WITH PROPOFOL ;  Surgeon: Gaylyn Gladis PENNER, MD;  Location: [facility] ENDOSCOPY;  Service: Endoscopy;  Laterality: N/A;   ESOPHAGOGASTRODUODENOSCOPY (EGD) WITH PROPOFOL  N/A 09/19/2023   Procedure: ESOPHAGOGASTRODUODENOSCOPY (EGD) WITH PROPOFOL ;  Surgeon: Toledo, Ladell POUR, MD;   Location: [facility] ENDOSCOPY;  Service: Gastroenterology;  Laterality: N/A;   KNEE ARTHROSCOPY Right    OOPHORECTOMY Right 1992   OOPHORECTOMY Left 1994   PAROTIDECTOMY     POLYPECTOMY  09/19/2023   Procedure: POLYPECTOMY;  Surgeon: Aundria, Ladell POUR, MD;  Location: [facility] ENDOSCOPY;  Service: Gastroenterology;;   RETINAL DETACHMENT SURGERY Right 2017   RETINAL DETACHMENT SURGERY Left 06/1983   RETINAL DETACHMENT SURGERY Right 08/1983   VAGINAL HYSTERECTOMY  1990   XI ROBOTIC ASSISTED PARAESOPHAGEAL HERNIA REPAIR N/A 01/22/2024   Procedure: REPAIR, HERNIA, PARAESOPHAGEAL, ROBOT-ASSISTED;  Surgeon: Jordis Laneta FALCON, MD;  Location: [facility] ORS;  Service: General;  Laterality: N/A;    Family History  Problem Relation Age of Onset   Heart disease Father    Dementia Father    Diabetes Sister    Lymphoma Maternal Uncle    Breast cancer Maternal Grandmother    Thyroid  disease Sister    Leukemia Cousin     Social History:  reports that she has never smoked. She has never been exposed to tobacco smoke. She has never used smokeless tobacco. She reports that she does not currently use alcohol. She reports that she does not currently use drugs.  Allergies:  Allergies  Allergen Reactions   Iodine Anaphylaxis and Hives   Shellfish Allergy Anaphylaxis and Hives    Shrimp and Crabs   Crestor [Rosuvastatin]     Cramps    Sulfa Antibiotics Rash   Tomato Rash    Medications reviewed.    ROS Full ROS performed and is otherwise negative  other than what is stated in HPI   BP 132/76   Pulse 73   Temp 98.2 F (36.8 C) (Oral)   Ht 5' 3 (1.6 m)   Wt 152 lb (68.9 kg)   SpO2 99%   BMI 26.93 kg/m   Physical Exam Vitals and nursing note reviewed. Exam conducted with a chaperone present.  Constitutional:      Appearance: Normal appearance.  Pulmonary:     Effort: Pulmonary effort is normal.     Breath sounds: No stridor.  Abdominal:     General: Abdomen is flat. There is no distension.      Palpations: Abdomen is soft. There is no mass.     Tenderness: There is abdominal tenderness. There is no guarding or rebound.     Hernia: No hernia is present.     Comments: Minimal tenderness in epigastric area without peritonitis or rebound  Skin:    General: Skin is warm and dry.     Capillary Refill: Capillary refill takes less than 2 seconds.  Neurological:     General: No focal deficit present.     Mental Status: She is alert and oriented to person, place, and time.  Psychiatric:        Mood and Affect: Mood normal.        Thought Content: Thought content normal.        Judgment: Judgment normal.       Assessment/Plan: 68 year old female status post paraesophageal hernia repair with significant improvement of symptoms including no reflux and minimal nausea.  She is very happy with outcomes.  She does have some intermittent abdominal burning sensation.  I do not necessarily explain this.  Differential diagnosis will include biliary colic.  Will obtain an ultrasound of the right upper quadrant and we will follow her up in a few weeks.  No need for hospitalization or surgical intervention at this time I personally spent a total of 20 minutes in the care of the patient today including performing a medically appropriate exam/evaluation, counseling and educating, placing orders, referring and communicating with other health care professionals, documenting clinical information in the EHR, independently interpreting and reviewing images studies and coordinating care.   Laneta Luna, MD [facility] General Surgeon

## 2024-05-12 ENCOUNTER — Ambulatory Visit

## 2024-05-16 ENCOUNTER — Ambulatory Visit
Admission: RE | Admit: 2024-05-16 | Discharge: 2024-05-16 | Disposition: A | Discharge status: Home / Self Care | Source: Ambulatory Visit | Attending: Surgery | Admitting: Surgery

## 2024-05-16 DIAGNOSIS — K30 Functional dyspepsia: Secondary | ICD-10-CM | POA: Insufficient documentation

## 2024-05-28 ENCOUNTER — Encounter: Payer: Self-pay | Admitting: Surgery

## 2024-05-28 ENCOUNTER — Ambulatory Visit: Admitting: Surgery

## 2024-05-28 VITALS — BP 103/72 | HR 71 | Ht 63.0 in

## 2024-05-28 DIAGNOSIS — K449 Diaphragmatic hernia without obstruction or gangrene: Secondary | ICD-10-CM | POA: Diagnosis not present

## 2024-05-28 DIAGNOSIS — Z09 Encounter for follow-up examination after completed treatment for conditions other than malignant neoplasm: Secondary | ICD-10-CM

## 2024-05-28 NOTE — Progress Notes (Signed)
 Outpatient Surgical Follow Up  05/28/2024  Chelsey Price is an 68 y.o. female.   Chief Complaint  Patient presents with   Follow-up    HPI: [name] 4 months out  after paraesophageal hernia repair. She endorses doing well and she is very happy with results. Endorses no reflux. No swallowing issues. Her nausea has significantly improved after the surgery. She does endorse some burning sensation in epigastric area that is intermittent without any specific alleviating or aggravating factors. No fevers no chills. He does feel full quicker  We obtained u/s that I reviewed pers showing no stones and benign liver cyst  Past Medical History:  Diagnosis Date   Abdominal adhesions    Anemia    Asthma without status asthmaticus    B12 deficiency    Cataract of both eyes    High myopia    IBS (irritable bowel syndrome)    Low vision, both eyes    Mixed hyperlipidemia    Pneumonia    PONV (postoperative nausea and vomiting)    Retinal detachment     Past Surgical History:  Procedure Laterality Date   BIOPSY  09/19/2023   Procedure: BIOPSY;  Surgeon: Aundria, Ladell POUR, MD;  Location: [facility] ENDOSCOPY;  Service: Gastroenterology;;   CATARACT EXTRACTION W/ INTRAOCULAR LENS IMPLANT Right 2017   COLONOSCOPY WITH PROPOFOL  N/A 04/16/2018   Procedure: COLONOSCOPY WITH PROPOFOL ;  Surgeon: Gaylyn Gladis PENNER, MD;  Location: [facility] ENDOSCOPY;  Service: Endoscopy;  Laterality: N/A;   COLONOSCOPY WITH PROPOFOL  N/A 09/19/2023   Procedure: COLONOSCOPY WITH PROPOFOL ;  Surgeon: Toledo, Ladell POUR, MD;  Location: [facility] ENDOSCOPY;  Service: Gastroenterology;  Laterality: N/A;   ESOPHAGOGASTRODUODENOSCOPY (EGD) WITH PROPOFOL  N/A 04/16/2018   Procedure: ESOPHAGOGASTRODUODENOSCOPY (EGD) WITH PROPOFOL ;  Surgeon: Gaylyn Gladis PENNER, MD;  Location: [facility] ENDOSCOPY;  Service: Endoscopy;  Laterality: N/A;   ESOPHAGOGASTRODUODENOSCOPY (EGD) WITH PROPOFOL  N/A 09/19/2023   Procedure: ESOPHAGOGASTRODUODENOSCOPY (EGD)  WITH PROPOFOL ;  Surgeon: Toledo, Ladell POUR, MD;  Location: [facility] ENDOSCOPY;  Service: Gastroenterology;  Laterality: N/A;   KNEE ARTHROSCOPY Right    OOPHORECTOMY Right 1992   OOPHORECTOMY Left 1994   PAROTIDECTOMY     POLYPECTOMY  09/19/2023   Procedure: POLYPECTOMY;  Surgeon: Aundria, Ladell POUR, MD;  Location: [facility] ENDOSCOPY;  Service: Gastroenterology;;   RETINAL DETACHMENT SURGERY Right 2017   RETINAL DETACHMENT SURGERY Left 06/1983   RETINAL DETACHMENT SURGERY Right 08/1983   VAGINAL HYSTERECTOMY  1990   XI ROBOTIC ASSISTED PARAESOPHAGEAL HERNIA REPAIR N/A 01/22/2024   Procedure: REPAIR, HERNIA, PARAESOPHAGEAL, ROBOT-ASSISTED;  Surgeon: Jordis Laneta FALCON, MD;  Location: [facility] ORS;  Service: General;  Laterality: N/A;    Family History  Problem Relation Age of Onset   Heart disease Father    Dementia Father    Diabetes Sister    Lymphoma Maternal Uncle    Breast cancer Maternal Grandmother    Thyroid  disease Sister    Leukemia Cousin     Social History:  reports that she has never smoked. She has never been exposed to tobacco smoke. She has never used smokeless tobacco. She reports that she does not currently use alcohol. She reports that she does not currently use drugs.  Allergies:  Allergies  Allergen Reactions   Iodine Anaphylaxis and Hives   Shellfish Allergy Anaphylaxis and Hives    Shrimp and Crabs   Crestor [Rosuvastatin]     Cramps    Sulfa Antibiotics Rash   Tomato Rash    Medications reviewed.    ROS Full ROS  performed and is otherwise negative other than what is stated in HPI   BP 103/72   Pulse 71   Ht 5' 3 (1.6 m)   SpO2 100%   BMI 26.93 kg/m   Physical Exam NAd alert Abd: soft, nt, incisions healed Ext: no edema and well perfused   Assessment/Plan: Doing well w/o complications Liver cyst looked benign , follow clinically  I personally spent a total of 30 minutes in the care of the patient today including performing a medically appropriate  exam/evaluation, counseling and educating, placing orders, referring and communicating with other health care professionals, documenting clinical information in the EHR, independently interpreting and reviewing images studies and coordinating care.   Laneta Luna, MD [facility] General Surgeon

## 2024-05-28 NOTE — Patient Instructions (Signed)
   Follow-up with our office as needed.  Please call and ask to speak with a nurse if you develop questions or concerns.

## 2024-09-18 ENCOUNTER — Other Ambulatory Visit: Payer: Self-pay | Admitting: Family Medicine

## 2024-09-18 DIAGNOSIS — Z780 Asymptomatic menopausal state: Secondary | ICD-10-CM

## 2024-09-18 DIAGNOSIS — Z1231 Encounter for screening mammogram for malignant neoplasm of breast: Secondary | ICD-10-CM
